# Patient Record
Sex: Female | Born: 1965 | Race: White | Hispanic: No | Marital: Married | Smoking: Former smoker
Health system: Southern US, Community
[De-identification: ages and names within clinical notes are randomized; demographics above are authoritative.]

## PROBLEM LIST (undated history)

## (undated) DIAGNOSIS — Z9889 Other specified postprocedural states: Secondary | ICD-10-CM

## (undated) DIAGNOSIS — F419 Anxiety disorder, unspecified: Secondary | ICD-10-CM

## (undated) DIAGNOSIS — T7840XA Allergy, unspecified, initial encounter: Secondary | ICD-10-CM

## (undated) DIAGNOSIS — D649 Anemia, unspecified: Secondary | ICD-10-CM

## (undated) HISTORY — PX: MYOMECTOMY ABDOMINAL APPROACH: SUR870

## (undated) HISTORY — DX: Anxiety disorder, unspecified: F41.9

## (undated) HISTORY — PX: RHINOPLASTY: SUR1284

## (undated) HISTORY — PX: OTHER SURGICAL HISTORY: SHX169

## (undated) HISTORY — DX: Anemia, unspecified: D64.9

## (undated) HISTORY — DX: Allergy, unspecified, initial encounter: T78.40XA

## (undated) HISTORY — PX: MANDIBLE SURGERY: SHX707

---

## 2001-12-08 ENCOUNTER — Other Ambulatory Visit: Admission: RE | Admit: 2001-12-08 | Discharge: 2001-12-08 | Payer: Self-pay | Admitting: Family Medicine

## 2001-12-15 ENCOUNTER — Encounter: Admission: RE | Admit: 2001-12-15 | Discharge: 2001-12-15 | Payer: Self-pay | Admitting: Family Medicine

## 2001-12-15 ENCOUNTER — Encounter: Payer: Self-pay | Admitting: Family Medicine

## 2002-02-23 ENCOUNTER — Encounter (INDEPENDENT_AMBULATORY_CARE_PROVIDER_SITE_OTHER): Payer: Self-pay | Admitting: *Deleted

## 2002-02-23 ENCOUNTER — Ambulatory Visit (HOSPITAL_COMMUNITY): Admission: RE | Admit: 2002-02-23 | Discharge: 2002-02-23 | Payer: Self-pay | Admitting: Gastroenterology

## 2004-01-24 ENCOUNTER — Other Ambulatory Visit: Admission: RE | Admit: 2004-01-24 | Discharge: 2004-01-24 | Payer: Self-pay | Admitting: Family Medicine

## 2005-02-22 ENCOUNTER — Encounter: Admission: RE | Admit: 2005-02-22 | Discharge: 2005-02-22 | Payer: Self-pay | Admitting: Family Medicine

## 2005-03-02 ENCOUNTER — Encounter: Admission: RE | Admit: 2005-03-02 | Discharge: 2005-03-02 | Payer: Self-pay | Admitting: Family Medicine

## 2005-05-06 ENCOUNTER — Other Ambulatory Visit: Admission: RE | Admit: 2005-05-06 | Discharge: 2005-05-06 | Payer: Self-pay | Admitting: Family Medicine

## 2005-08-10 ENCOUNTER — Inpatient Hospital Stay (HOSPITAL_COMMUNITY): Admission: RE | Admit: 2005-08-10 | Discharge: 2005-08-12 | Payer: Self-pay | Admitting: Obstetrics and Gynecology

## 2005-08-10 ENCOUNTER — Encounter (INDEPENDENT_AMBULATORY_CARE_PROVIDER_SITE_OTHER): Payer: Self-pay | Admitting: *Deleted

## 2006-05-09 ENCOUNTER — Other Ambulatory Visit: Admission: RE | Admit: 2006-05-09 | Discharge: 2006-05-09 | Payer: Self-pay | Admitting: Family Medicine

## 2007-06-16 ENCOUNTER — Other Ambulatory Visit: Admission: RE | Admit: 2007-06-16 | Discharge: 2007-06-16 | Payer: Self-pay | Admitting: Family Medicine

## 2007-10-13 ENCOUNTER — Encounter: Admission: RE | Admit: 2007-10-13 | Discharge: 2007-10-13 | Payer: Self-pay | Admitting: Family Medicine

## 2007-10-25 ENCOUNTER — Encounter: Admission: RE | Admit: 2007-10-25 | Discharge: 2007-10-25 | Payer: Self-pay | Admitting: Family Medicine

## 2007-10-25 ENCOUNTER — Encounter (INDEPENDENT_AMBULATORY_CARE_PROVIDER_SITE_OTHER): Payer: Self-pay | Admitting: Radiology

## 2007-11-03 ENCOUNTER — Encounter: Admission: RE | Admit: 2007-11-03 | Discharge: 2007-11-03 | Payer: Self-pay | Admitting: Family Medicine

## 2007-11-13 ENCOUNTER — Encounter (INDEPENDENT_AMBULATORY_CARE_PROVIDER_SITE_OTHER): Payer: Self-pay | Admitting: Diagnostic Radiology

## 2007-11-13 ENCOUNTER — Encounter: Admission: RE | Admit: 2007-11-13 | Discharge: 2007-11-13 | Payer: Self-pay | Admitting: Family Medicine

## 2008-03-11 ENCOUNTER — Ambulatory Visit (HOSPITAL_COMMUNITY): Admission: RE | Admit: 2008-03-11 | Discharge: 2008-03-11 | Payer: Self-pay | Admitting: Obstetrics and Gynecology

## 2008-05-10 ENCOUNTER — Encounter: Admission: RE | Admit: 2008-05-10 | Discharge: 2008-05-10 | Payer: Self-pay | Admitting: Family Medicine

## 2008-09-17 ENCOUNTER — Other Ambulatory Visit: Admission: RE | Admit: 2008-09-17 | Discharge: 2008-09-17 | Payer: Self-pay | Admitting: Family Medicine

## 2008-11-15 ENCOUNTER — Encounter: Admission: RE | Admit: 2008-11-15 | Discharge: 2008-11-15 | Payer: Self-pay | Admitting: Family Medicine

## 2009-11-12 ENCOUNTER — Other Ambulatory Visit: Admission: RE | Admit: 2009-11-12 | Discharge: 2009-11-12 | Payer: Self-pay | Admitting: Family Medicine

## 2010-12-25 NOTE — Procedures (Signed)
[place]  Patient:    Carol Sutton, Carol Sutton Visit Number: [id number] MRN: [id number]          Service Type: END Location: ENDO Attending Physician:  Orland Mustard Dictated by:   Llana Aliment. Randa Evens, M.D. Proc. Date: 02/23/02 Admit Date:  02/23/2002 Discharge Date: 02/23/2002   CC:         Stacie Acres. Cliffton Asters, M.D.   Procedure Report  DATE OF BIRTH:  11/09/2065  PROCEDURE:  Colonoscopy and biopsy.  MEDICATIONS:  Fentanyl 70 mcg, Versed 7 mg IV.  INDICATIONS FOR PROCEDURE:  Rectal bleeding in a 45 year old new onset.  DESCRIPTION OF PROCEDURE:  The procedure was explained to the patient and consent was obtained.  With the patient in the left lateral decubitus position the Olympus Pediatric video colonoscope was inserted under direct visualization.  Prep was excellent.  We were able to advance easily to the cecum.  The ileocecal valve and appendiceal orifice were seen.  The scope was withdrawn.  The colon was carefully examined.  the mucosa was completely normal on the right.  The colon transverse left and sigmoid colon no polyps or other lesions seen.  No diverticula.  The scope was withdrawn from the rectum. On retroflexed view the patient was seen to have internal hemorrhoids.  There was a very mild redness in the distal rectum.  A couple biopsies were obtain. The feeling was that this was probably some very distal proctitis.  The scope was withdrawn.  The patient tolerated the procedure well.  The patient was maintained on low flow oxygen and pulse oximetry throughout the procedure.  ASSESSMENT: 1. Probable mild proctitis. 2. Internal hemorrhoids.  PLAN:  Will check pathology and see back in the office in six to eight weeks. Dictated by:   Llana Aliment. Randa Evens, M.D. Attending Physician:  Orland Mustard DD:  02/23/02 TD:  02/28/02 Job: [id number]

## 2010-12-25 NOTE — H&P (Signed)
Carol Sutton, Carol NO.:  [id number]   MEDICAL RECORD NO.:  [id number]          PATIENT TYPE:  INP   LOCATION:  NA                            FACILITY:  [facility]   PHYSICIAN:  Gerald Leitz, MD          DATE OF BIRTH:  Jan 03, 1966   DATE OF ADMISSION:  DATE OF DISCHARGE:                                HISTORY & PHYSICAL   HISTORY OF PRESENT ILLNESS:  This is a 45 year old, G1, P1 with symptomatic  uterine fibroids who desires future fertility.  She had an ultrasound, on  May 11, 2005, at [facility] that showed an intramural fundal  fibroid that was 5.7 x 6.0-cm that slightly distorted the contour of the  endometrium, both ovaries appeared normal.  She has a history of  intramenstrual bleeding and has had a negative endometrial biopsy that was  performed on May 24, 2005.   PAST MEDICAL HISTORY:  Negative.   PAST GYNECOLOGIC HISTORY:  No history of sexually transmitted diseases.  No  history of abnormal Pap smears.  Her last Pap smear was May 06, 2005,  and this was normal.   PAST SURGICAL HISTORY:  1.  TMJ surgery.  2.  Rhinoplasty.   MEDICATIONS:  Yasmin and iron.   ALLERGIES:  No known drug allergies.   SOCIAL HISTORY:  The patient is divorced.  She currently works as a Human resources officer.  She has a history of tobacco use but quit in 2001.  She reports  approximately four glasses of wine every week.  Denies illicit drug use.   FAMILY HISTORY:  Mother with questionable uterine cancer.   REVIEW OF SYSTEMS:  Negative except as stated in the history of current  illness.   PHYSICAL EXAMINATION:  VITAL SIGNS:  Blood pressure 120/72, heart rate 72,  height 64 and 1/4 inches, weight 137.5 pounds.  CARDIOVASCULAR:  Regular rate and rhythm.  LUNGS:  Clear to auscultation bilaterally.  ABDOMEN:  Soft, nontender, nondistended.  No masses appreciated.  EXTREMITIES:  No clubbing, cyanosis, or edema.  PELVIC:  Normal external female genitalia.   No vulvar, vaginal, or cervical  lesions are noted.  Bimanual exam reveals a 12-week to 14-week size uterus  which is freely mobile.  No adnexal masses or tenderness.   ASSESSMENT:  Symptomatic uterine fibroids.   The patient desires future fertility.  Discussed with the patient surgical  options.  She desires a myomectomy.  Risks, benefits, alternatives of  myomectomy including but not limited to infection, bleeding, damage to  surrounding organs  such as bowel and bladder, and transfusion were discussed with the patient.  The risk of transfusion, HIV, hepatitis B, C, and syphilis were also  discussed.  The patient understands the risks.  She desires to proceed with  myomectomy and understands that this could be converted to a hysterectomy if  blood loss is severe.      Gerald Leitz, MD  Electronically Signed     TC/MEDQ  D:  08/04/2005  T:  08/04/2005  Job:  [id number]

## 2010-12-25 NOTE — Op Note (Signed)
NAMELAURETTA, Carol Sutton              ACCOUNT NO.:  [id number]   MEDICAL RECORD NO.:  [id number]          PATIENT TYPE:  INP   LOCATION:  9399                          FACILITY:  [facility]   PHYSICIAN:  Gerald Leitz, MD          DATE OF BIRTH:  [birth year]   DATE OF PROCEDURE:  08/10/2005  DATE OF DISCHARGE:                                 OPERATIVE REPORT   PREOPERATIVE DIAGNOSIS:  Symptomatic uterine fibroids and menorrhagia.   POSTOPERATIVE DIAGNOSIS:  Symptomatic uterine fibroids and menorrhagia.   PROCEDURE:  Abdominal myomectomy.   SURGEON:  Gerald Leitz, M.D.   ASSISTANT:  Bing Neighbors. Sydnee Cabal, M.D.   ANESTHESIA:  General.   SPECIMENS:  Uterine fibroid.   ESTIMATED BLOOD LOSS:  Minimal.   COMPLICATIONS:  None.   FINDINGS:  An approximately 7-cm fundal fibroid with distortion of the  endometrium.  Normal fallopian tubes and ovaries bilaterally.   DESCRIPTION OF PROCEDURE:  The patient was taken to the operating room where  she was placed under general anesthesia.  She was then prepped and draped in  the usual sterile fashion.   A Pfannenstiel skin incision was made with the scalpel and carried down to  the underlying layer of fascia.  The fascia was incised in the midline.  The  incision was extended laterally with Mayo scissors.  The superior aspect of  the fascial incision was grasped with Kocher clamps, elevated, and the  underlying rectus muscles dissected off with Mayo scissors.  This was  repeated on the inferior aspect of the fascial incision.  The rectus muscles  were separated in the midline.  The peritoneum was identified and entered  sharply with Metzenbaum scissors.  The incision was extended superiorly and  inferiorly, with good visualization of the bladder.  The abdomen was  inspected, with the findings noted above.  The uterus was exteriorized.  The  serosa of the uterus over the fibroid was injected with 10 cc of  vasopressin.  An incision was made in the uterus  with a scalpel.  The  fibroid was noted.  It was dissected out with blunt and sharp dissection.  The endometrium was noted to be compromised.  This was repaired with 4-0  Vicryl in a running fashion.  The uterine incision was repaired with 0  Monocryl in an interrupted fashion.  A second layer of the same suture was  used for hemostasis.  The serosa was closed with 4-0 Vicryl in a running  fashion using baseball stitch.  The abdomen was copiously irrigated.  Interceed was placed over the uterine incision.  The uterus was returned to  the abdomen.  The fascia was closed with 0 PDS in a running fashion.  The  skin was closed with 4-0 Vicryl.  Sponge, lap, and needle counts were  correct x2.   The patient was awakened from anesthesia and taken to the recovery room in  stable condition.  Please note that the patient should have a cesarean  section if she were to become pregnant in the future due to the location of  the uterine incision which was fundal and approximately 5 cm in length.  Again, a cesarean section is recommended for all future pregnancies.      Gerald Leitz, MD  Electronically Signed     TC/MEDQ  D:  08/10/2005  T:  08/10/2005  Job:  [id number]

## 2012-04-07 ENCOUNTER — Other Ambulatory Visit: Payer: Self-pay | Admitting: Family Medicine

## 2012-04-07 DIAGNOSIS — Z1231 Encounter for screening mammogram for malignant neoplasm of breast: Secondary | ICD-10-CM

## 2012-04-19 ENCOUNTER — Ambulatory Visit
Admission: RE | Admit: 2012-04-19 | Discharge: 2012-04-19 | Disposition: A | Discharge status: Home / Self Care | Payer: 59 | Source: Ambulatory Visit | Attending: Family Medicine | Admitting: Family Medicine

## 2012-04-19 DIAGNOSIS — Z1231 Encounter for screening mammogram for malignant neoplasm of breast: Secondary | ICD-10-CM

## 2012-08-21 ENCOUNTER — Ambulatory Visit: Payer: 59 | Admitting: Physician Assistant

## 2012-08-21 VITALS — BP 110/72 | HR 71 | Temp 98.1°F | Resp 18 | Ht 64.0 in | Wt 145.0 lb

## 2012-08-21 DIAGNOSIS — Z23 Encounter for immunization: Secondary | ICD-10-CM

## 2012-08-21 DIAGNOSIS — R300 Dysuria: Secondary | ICD-10-CM

## 2012-08-21 DIAGNOSIS — R350 Frequency of micturition: Secondary | ICD-10-CM

## 2012-08-21 LAB — POCT URINALYSIS DIPSTICK
Bilirubin, UA: NEGATIVE
Glucose, UA: NEGATIVE
Nitrite, UA: NEGATIVE
Spec Grav, UA: 1.015
Urobilinogen, UA: 0.2

## 2012-08-21 LAB — POCT UA - MICROSCOPIC ONLY

## 2012-08-21 MED ORDER — PHENAZOPYRIDINE HCL 200 MG PO TABS: 200.0000 mg | ORAL_TABLET | Freq: Three times a day (TID) | ORAL | Status: DC | PRN

## 2012-08-21 MED ORDER — CIPROFLOXACIN HCL 500 MG PO TABS: 500.0000 mg | ORAL_TABLET | Freq: Two times a day (BID) | ORAL | Status: DC

## 2012-08-21 NOTE — Progress Notes (Signed)
Patient ID: Carol Sutton MRN: [id number], DOB: [birth year], 47 y.o. Date of Encounter: 08/21/2012, 12:02 PM  Primary Physician: Cala Bradford, MD  Chief Complaint: urinary frequency and dysuria  HPI: 47 y.o. year old female with presents with 1 day history of urinary frequency, dysuria, and suprapubic pressure. Denies flank pain, nausea, vomiting, fever, chills, vaginal discharge, or odor. Has not tried any OTC medications yet. LNMP 1 year ago.  Patient is otherwise doing well without issues or complaints.  Past Medical History  Diagnosis Date  . Allergy   . Anemia      Home Meds: Prior to Admission medications   Medication Sig Start Date End Date Taking? Authorizing Provider  Nutritional Supplements (ESTROVEN PM PO) Take by mouth.   Yes Historical Provider, MD  zolpidem (AMBIEN) 10 MG tablet Take 10 mg by mouth at bedtime as needed.   Yes Historical Provider, MD    Allergies:  Allergies  Allergen Reactions  . Sulfa Antibiotics Swelling and Rash    History   Social History  . Marital Status: Married    Spouse Name: N/A    Number of Children: N/A  . Years of Education: N/A   Occupational History  . Not on file.   Social History Main Topics  . Smoking status: Never Smoker   . Smokeless tobacco: Not on file  . Alcohol Use: Yes  . Drug Use: No  . Sexually Active: Yes    Birth Control/ Protection: Implant   Other Topics Concern  . Not on file   Social History Narrative  . No narrative on file     Review of Systems: Constitutional: negative for chills, fever, night sweats, weight changes, or fatigue  HEENT: negative for vision changes, hearing loss, congestion, rhinorrhea, ST, epistaxis, or sinus pressure Cardiovascular: negative for chest pain or palpitations Respiratory: negative for cough, hemoptysis, wheezing, shortness of breath. Abdominal: positive for suprapubic abdominal pain,   No nausea, vomiting, diarrhea, or constipation Genitourinary:  positive for urinary frequency and dysuria. Negative for vaginal discharge, odor, or pelvic pain.  Dermatological: negative for rashes. Neurologic: negative for headache, dizziness, or syncope   Physical Exam: Blood pressure 110/72, pulse 71, temperature 98.1 F (36.7 C), temperature source Oral, resp. rate 18, height 5\' 4"  (1.626 m), weight 145 lb (65.772 kg), SpO2 99.00%., Body mass index is 24.89 kg/(m^2). General: Well developed, well nourished, in no acute distress. Head: Normocephalic, atraumatic, eyes without discharge, sclera non-icteric, nares are without discharge. External ear normal in appearance. Neck: Supple. No thyromegaly. Full ROM. No lymphadenopathy. Lungs: Clear bilaterally to auscultation without wheezes, rales, or rhonchi. Breathing is unlabored. Heart: RRR with S1 S2. No murmurs, rubs, or gallops appreciated. Abdominal: +BS x 4. No hepatosplenomegaly, rebound tenderness, or guarding. Positive suprapubic tenderness. No CVA tenderness bilaterally.  Msk:  Strength and tone normal for age. Extremities/Skin: Warm and dry. No clubbing or cyanosis. No edema.  Neuro: Alert and oriented X 3. Moves all extremities spontaneously. Gait is normal. CNII-XII grossly in tact. Psych:  Responds to questions appropriately with a normal affect.   Labs: Results for orders placed in visit on 08/21/12  POCT URINALYSIS DIPSTICK      Component Value Range   Color, UA pinkish-yellow     Clarity, UA cloudy     Glucose, UA neg     Bilirubin, UA neg     Ketones, UA neg     Spec Grav, UA 1.015     Blood, UA large  pH, UA 6.0     Protein, UA 100     Urobilinogen, UA 0.2     Nitrite, UA neg     Leukocytes, UA large (3+)    POCT UA - MICROSCOPIC ONLY      Component Value Range   WBC, Ur, HPF, POC 15-25     RBC, urine, microscopic TNTC     Bacteria, U Microscopic trace     Mucus, UA neg     Epithelial cells, urine per micros 2-4     Crystals, Ur, HPF, POC neg     Casts, Ur, LPF, POC  neg     Yeast, UA neg      ASSESSMENT AND PLAN:  47 y.o. year old female with urinary tract infection Urine culture sent Increase fluids Cipro 500 mg bid x 5 days Pyridium tid as needed for symptomatic relief Follow up if symptoms worsen or fail to improve.  Grier Mitts, PA-C 08/21/2012 12:02 PM

## 2012-08-23 LAB — URINE CULTURE: Colony Count: >=100000

## 2012-10-14 ENCOUNTER — Ambulatory Visit: Payer: 59 | Admitting: Physician Assistant

## 2012-10-14 VITALS — BP 102/62 | HR 66 | Temp 97.8°F | Resp 16 | Ht 64.0 in | Wt 145.0 lb

## 2012-10-14 DIAGNOSIS — H109 Unspecified conjunctivitis: Secondary | ICD-10-CM

## 2012-10-14 MED ORDER — KETOROLAC TROMETHAMINE 0.5 % OP SOLN: 1.0000 [drp] | Freq: Four times a day (QID) | OPHTHALMIC | Status: DC

## 2012-10-14 NOTE — Patient Instructions (Signed)
If your symptoms are not improved in the next weeks, or if they worsen, please schedule a visit with your eye specialist. An excellent rewetting drop for eyes is Systane Balance.

## 2012-10-14 NOTE — Progress Notes (Signed)
  Subjective:    Patient ID: Carol Sutton, female    DOB: March 03, 1966, 47 y.o.   MRN: [id number]  HPI This 47 y.o. female presents for evaluation of eye redness and crusting x several weeks, intermittently.  No pain or itching.  Using OTC drops without relief.  One of the drops (for grittiness, redness) caused stinging. No visual change, but feels like a film is over her eyes.  No drainage during the day, crusting on the lashes each morning.  No known sick contacts, no recent illness.   Past Medical History  Diagnosis Date  . Allergy   . Anemia     Past Surgical History  Procedure Laterality Date  . Fibroid tumor      Prior to Admission medications   Medication Sig Start Date End Date Taking? Authorizing Provider  Nutritional Supplements (ESTROVEN PM PO) Take by mouth.   Yes Historical Provider, MD  zolpidem (AMBIEN) 10 MG tablet Take 10 mg by mouth at bedtime as needed.   Yes Historical Provider, MD    Allergies  Allergen Reactions  . Sulfa Antibiotics Swelling and Rash    History   Social History  . Marital Status: Married    Spouse Name: Thayer Ohm    Number of Children: 1  . Years of Education: 16   Occupational History  . BUSINESS MANAGER [employer]   Social History Main Topics  . Smoking status: Former Smoker    Quit date: 10/15/2007  . Smokeless tobacco: Never Used  . Alcohol Use: 0 - 2.5 oz/week    0-5 drink(s) per week  . Drug Use: No  . Sexually Active: Yes -- Female partner(s)    Birth Control/ Protection: IUD     Comment: Mirena   Other Topics Concern  . Not on file   Social History Narrative   Lives with her husband.    Family History  Problem Relation Age of Onset  . Hyperlipidemia Mother   . Heart disease Father      Review of Systems As above.    Objective:   Physical Exam BP 102/62  Pulse 66  Temp(Src) 97.8 F (36.6 C) (Oral)  Resp 16  Ht 5\' 4"  (1.626 m)  Wt 145 lb (65.772 kg)  BMI 24.88 kg/m2  SpO2 98%  Visual Acuity Screening   Right eye Left eye Both eyes  Without correction: 20/15 20/20 20/15   With correction:      WDWNWF, A&Ox 3. [place]/AT, PERRL, EOMI.  Mild injection of the conjunctiva.  No drainage.  No cloudiness of the anterior chamber.  Funduscopic exam normal bilaterally.  No periauricular nodes.  No cervical nodes.  Heart: Regular rate.  Lungs: Normal effort.  Skin is warm and dry without lesion.     Assessment & Plan:  Conjunctivitis unspecified - Plan: ketorolac (ACULAR) 0.5 % ophthalmic solution  Patient Instructions  If your symptoms are not improved in the next weeks, or if they worsen, please schedule a visit with your eye specialist. An excellent rewetting drop for eyes is Systane Balance.

## 2013-03-12 ENCOUNTER — Other Ambulatory Visit: Payer: Self-pay | Admitting: Family Medicine

## 2013-03-12 DIAGNOSIS — Z0000 Encounter for general adult medical examination without abnormal findings: Secondary | ICD-10-CM | POA: Insufficient documentation

## 2013-04-11 ENCOUNTER — Other Ambulatory Visit (HOSPITAL_COMMUNITY)
Admission: RE | Admit: 2013-04-11 | Discharge: 2013-04-11 | Disposition: A | Discharge status: Home / Self Care | Payer: 59 | Source: Ambulatory Visit | Attending: Family Medicine | Admitting: Family Medicine

## 2013-05-16 ENCOUNTER — Ambulatory Visit (INDEPENDENT_AMBULATORY_CARE_PROVIDER_SITE_OTHER): Payer: 59

## 2013-05-16 VITALS — BP 95/62 | HR 80 | Resp 20 | Ht 64.0 in | Wt 145.0 lb

## 2013-05-16 DIAGNOSIS — G5760 Lesion of plantar nerve, unspecified lower limb: Secondary | ICD-10-CM

## 2013-05-16 DIAGNOSIS — G5762 Lesion of plantar nerve, left lower limb: Secondary | ICD-10-CM

## 2013-05-16 NOTE — Patient Instructions (Signed)
Morton's Neuroma Neuralgia (nerve pain) or neuroma (benign [non-cancerous] nerve tumor) may develop on any interdigital nerve. The interdigital nerves (nerves between digits) of the foot travel beneath and between the metatarsals (long bones of the fore foot) and pass the nerve endings to the toes. The third interdigital is a common place for a small neuroma to form called Morton's neuroma. Another nerve to be affected commonly is the fourth interdigital nerve. This would be in approximately in the area of the base or ball under the bottom of your fourth toe. This condition occurs more commonly in women and is usually on one side. It is usually first noticed by pain radiating (spreading) to the ball of the foot or to the toes. CAUSES The cause of interdigital neuralgia may be from low grade repetitive trauma (damage caused by an accident) as in activities causing a repeated pounding of the foot (running, jumping etc.). It is also caused by improper footwear or recent loss of the fatty padding on the bottom of the foot. TREATMENT  The condition often resolves (goes away) simply with decreasing activity if that is thought to be the cause. Proper shoes are beneficial. Orthotics (special foot support aids) such as a metatarsal bar are often beneficial. This condition usually responds to conservative therapy, however if surgery is necessary it usually brings complete relief. HOME CARE INSTRUCTIONS   Apply ice to the area of soreness for 15-20 minutes, 3-4 times per day, while awake for the first 2 days. Put ice in a plastic bag and place a towel between the bag of ice and your skin.  Only take over-the-counter or prescription medicines for pain, discomfort, or fever as directed by your caregiver. MAKE SURE YOU:   Understand these instructions.  Will watch your condition.  Will get help right away if you are not doing well or get worse. Document Released: 11/01/2000 Document Revised: 10/18/2011 Document  Reviewed: 07/26/2005 Exit[employer] Patient Information 2014 [employer] ICE INSTRUCTIONS  Apply ice or cold pack to the affected area at least 3 times a day for 10-15 minutes each time.  You should also use ice after prolonged activity or vigorous exercise.  Do not apply ice longer than 20 minutes at one time.  Always keep a cloth between your skin and the ice pack to prevent [place].  Being consistent and following these instructions will help control your symptoms.  We suggest you purchase a gel ice pack because they are reusable and do bit leak.  Some of them are designed to wrap around the area.  Use the method that works best for you.  Here are some other suggestions for icing.   Use a frozen bag of peas or corn-inexpensive and molds well to your body, usually stays frozen for 10 to 20 minutes.  Wet a towel with cold water and squeeze out the excess until it's damp.  Place in a bag in the freezer for 20 minutes. Then remove and use. 

## 2013-05-16 NOTE — Progress Notes (Signed)
  Subjective:    Patient ID: Carol Sutton, female    DOB: 12/09/1965, 47 y.o.   MRN: [id number]  HPI Comments: "It feels better for about a week after shot, then it starts to hurt like before."  patient presents for followup of neuroma she's had 6 alcohol sclerosing injections at this point to the third interspace of her left foot. Patient describes improvement however by the end of the week has had flareups with shooting pain into the toes again.    Review of Systems  Constitutional: Negative.   HENT: Negative.   Eyes: Negative.   Respiratory: Negative.   Cardiovascular: Negative.   Gastrointestinal: Negative.   Endocrine: Positive for cold intolerance.  Genitourinary: Negative.   Musculoskeletal: Negative.   Skin: Negative.   Allergic/Immunologic: Positive for food allergies.  Neurological: Negative.   Hematological: Negative.        Objective:   Physical Exam  Constitutional: She is oriented to person, place, and time. She appears well-developed and well-nourished.  Cardiovascular: Intact distal pulses.   Capillary fill time 3 seconds all digits bilateral  Neurological: She is alert and oriented to person, place, and time. She has normal reflexes.  Skin: Skin is warm and dry.  Psychiatric: She has a normal mood and affect. Her behavior is normal. Judgment and thought content normal.   neurovascular status is intact with pedal pulses palpable. Epicritic and proprioceptive sensations intact and symmetric bilateral. May be some reduced sensation third interspace left consistent with series of injections. Orthopedic biomechanical exam otherwise unremarkable rectus foot type and digits are noted dermatologic exam unremarkable.        Assessment & Plan:  Morton's neuroma left third interspace resolving are improving slowly, some symptoms still present. Injection delivered today 1 mL 4% dehydrated alcohol and 0.5% Marcaine plain delivered to the third interspace left foot, third,  and plantar digital nerve.  Followup in one month for long-term reevaluation, if no improvement may be a candidate for surgical option.  Alvan Dame DPM

## 2013-05-18 ENCOUNTER — Ambulatory Visit: Payer: Self-pay

## 2013-05-18 ENCOUNTER — Ambulatory Visit: Payer: 59

## 2013-06-13 ENCOUNTER — Ambulatory Visit (INDEPENDENT_AMBULATORY_CARE_PROVIDER_SITE_OTHER): Payer: 59

## 2013-06-13 VITALS — BP 107/66 | HR 78 | Resp 12

## 2013-06-13 DIAGNOSIS — M778 Other enthesopathies, not elsewhere classified: Secondary | ICD-10-CM

## 2013-06-13 DIAGNOSIS — G5762 Lesion of plantar nerve, left lower limb: Secondary | ICD-10-CM

## 2013-06-13 DIAGNOSIS — G5760 Lesion of plantar nerve, unspecified lower limb: Secondary | ICD-10-CM

## 2013-06-13 DIAGNOSIS — M7750 Other enthesopathy of unspecified foot: Secondary | ICD-10-CM

## 2013-06-13 MED ORDER — PREDNISONE 10 MG PO KIT: 1.0000 | PACK | Freq: Four times a day (QID) | ORAL | Status: DC

## 2013-06-13 NOTE — Progress Notes (Signed)
  Subjective:    Patient ID: Carol Sutton, female    DOB: Mar 20, 1966, 47 y.o.   MRN: [id number]  HPI Comments: '' LT FOOT IS DOING OK''  Foot Pain   continues to have some slight foot pain on the left foot on direct lateral compression does not have is intense pain as it had initially. However continues to have some pain especially when she gets up in the morning feels stiff or achy. Does not have a lot of sharper shooting pain on direct lateral compression. No Mulder sign noted.    Review of Systems deferred at this visit     Objective:   Physical Exam Neurovascular status is intact and unchanged pedal pulses are palpable epicritic and proprioceptive sensations intact. There is decreased sensation in the fourth interspace consistent with the alcohol injections reducing sensation. Her patient remained fact have some mild arthropathy or capsulitis of the MTP joints themselves in particular third and fourth MTP joints. No significant swelling is noted no discoloration is noted. No pain on dorsiflexion or plantarflexion of the digits.     Assessment & Plan:  Assessment some improvement in neuroma symptomology third interspace neuroma left foot. Following alcohol sclerosing injections x7. There continues to be possible capsulitis and posse some inflammatory pain of the fourth interspace and third and fourth MTP joints. As an adjunct to previous treatment patient at this time is placed on a Sterapred Ds Dosepak x12 days.. Maintain accommodative shoes also continue with ice packs to the affected areas. Followup within the next one to 2 months likely after the holiday. If she continues to have difficulty make consider either additional injections or possibly surgical interventions.  Alvan Dame DPM

## 2013-06-13 NOTE — Patient Instructions (Signed)
ICE INSTRUCTIONS  Apply ice or cold pack to the affected area at least 3 times a day for 10-15 minutes each time.  You should also use ice after prolonged activity or vigorous exercise.  Do not apply ice longer than 20 minutes at one time.  Always keep a cloth between your skin and the ice pack to prevent [place].  Being consistent and following these instructions will help control your symptoms.  We suggest you purchase a gel ice pack because they are reusable and do bit leak.  Some of them are designed to wrap around the area.  Use the method that works best for you.  Here are some other suggestions for icing.   Use a frozen bag of peas or corn-inexpensive and molds well to your body, usually stays frozen for 10 to 20 minutes.  Wet a towel with cold water and squeeze out the excess until it's damp.  Place in a bag in the freezer for 20 minutes. Then remove and use. 

## 2013-06-22 ENCOUNTER — Telehealth: Payer: Self-pay | Admitting: *Deleted

## 2013-06-22 NOTE — Telephone Encounter (Signed)
Pt states received 48 Prednisone 10mg , and no instructions.  DR Ralene Cork state is a 12 day pk tapering down from 6 pills.

## 2013-08-29 ENCOUNTER — Ambulatory Visit (INDEPENDENT_AMBULATORY_CARE_PROVIDER_SITE_OTHER): Payer: 59

## 2013-08-29 VITALS — BP 126/68 | HR 62 | Resp 12

## 2013-08-29 DIAGNOSIS — M7752 Other enthesopathy of left foot: Secondary | ICD-10-CM

## 2013-08-29 DIAGNOSIS — G5760 Lesion of plantar nerve, unspecified lower limb: Secondary | ICD-10-CM

## 2013-08-29 DIAGNOSIS — M79609 Pain in unspecified limb: Secondary | ICD-10-CM

## 2013-08-29 DIAGNOSIS — M7750 Other enthesopathy of unspecified foot: Secondary | ICD-10-CM

## 2013-08-29 NOTE — Progress Notes (Signed)
   Subjective:    Patient ID: Carol Sutton, female    DOB: 10/31/1965, 48 y.o.   MRN: [id number]  HPI Comments: '' LT FOOT STILL HURTING AND NOTHING CHANGE.''  Foot Pain      Review of Systems no changes     Objective:   Physical Exam Neurovascular status is intact pedal pulses palpable. Patient still not having pain on direct compression and lateral compression of the proximal interspace cover the distalat the MTP level particular run the fourth MTP still some tenderness on direct and not as much on lateral compression. Still some slight paresthesia or neuritis or neuralgia present as such patient is given option of an initial booster alcohol injection versus possible surgical intervention and excision of neuroma. Affect is considerably better although not completely resolved my recommendation is an additional alcohol injection at this time also maintain ice compression and an NSAID as needed for pain reevaluate in one month       Assessment & Plan:  Assessment persistent Morton's neuroma posse some mild residual arthrosis and capsulitis fourth MTP area has had improvement as far as neuroma symptomology with a series of alcohol injections at this time an a.c. injection 1 cc 4% alcohol solution and 0.5% Marcaine plain delivered more distal into the third interspace. Patient tolerated the injection will continue with ice packs and NSAIDs recheck in 4 weeks for followup  Carol Sutton DPM

## 2013-08-29 NOTE — Patient Instructions (Signed)
ICE INSTRUCTIONS  Apply ice or cold pack to the affected area at least 3 times a day for 10-15 minutes each time.  You should also use ice after prolonged activity or vigorous exercise.  Do not apply ice longer than 20 minutes at one time.  Always keep a cloth between your skin and the ice pack to prevent [place].  Being consistent and following these instructions will help control your symptoms.  We suggest you purchase a gel ice pack because they are reusable and do bit leak.  Some of them are designed to wrap around the area.  Use the method that works best for you.  Here are some other suggestions for icing.   Use a frozen bag of peas or corn-inexpensive and molds well to your body, usually stays frozen for 10 to 20 minutes.  Wet a towel with cold water and squeeze out the excess until it's damp.  Place in a bag in the freezer for 20 minutes. Then remove and use. 

## 2013-10-03 ENCOUNTER — Ambulatory Visit (INDEPENDENT_AMBULATORY_CARE_PROVIDER_SITE_OTHER): Payer: 59

## 2013-10-03 VITALS — BP 117/67 | HR 76 | Resp 12

## 2013-10-03 DIAGNOSIS — M79609 Pain in unspecified limb: Secondary | ICD-10-CM

## 2013-10-03 DIAGNOSIS — G5760 Lesion of plantar nerve, unspecified lower limb: Secondary | ICD-10-CM

## 2013-10-03 NOTE — Progress Notes (Signed)
° °  Subjective:    Patient ID: Carol Sutton, female    DOB: 1966/02/20, 48 y.o.   MRN: [id number]  HPI '' LT FOOT STILL HAVING AND NOTHING CHANGE.''    Review of Systems     Objective:   Physical Exam        Assessment & Plan:

## 2013-10-03 NOTE — Patient Instructions (Signed)
ICE INSTRUCTIONS  Apply ice or cold pack to the affected area at least 3 times a day for 10-15 minutes each time.  You should also use ice after prolonged activity or vigorous exercise.  Do not apply ice longer than 20 minutes at one time.  Always keep a cloth between your skin and the ice pack to prevent [place].  Being consistent and following these instructions will help control your symptoms.  We suggest you purchase a gel ice pack because they are reusable and do bit leak.  Some of them are designed to wrap around the area.  Use the method that works best for you.  Here are some other suggestions for icing.   Use a frozen bag of peas or corn-inexpensive and molds well to your body, usually stays frozen for 10 to 20 minutes.  Wet a towel with cold water and squeeze out the excess until it's damp.  Place in a bag in the freezer for 20 minutes. Then remove and use.  Followup arrangements for physical therapy for leaks 3 weeks 3 times a week

## 2013-10-03 NOTE — Progress Notes (Signed)
   Subjective:    Patient ID: Francenia Chimenti, female    DOB: 1965-10-31, 48 y.o.   MRN: [id number]  HPI patient continues to pain third intermetatarsal space left foot the third and fourth toes continues to pain burning pain not severe especially the still present despite a series of alcohol injections we 7 injections or taken place has a steroid injections has other changes shoes exercise NSAID therapies. At this time discussed further options    Review of Systems no new changes or findings noted     Objective:   Physical Exam Neurovascular status is intact pedal pulses palpable epicritic and proprioceptive sensations intact there is significant pain on direct compression third intermetatarsal space between the third and fourth metatarsals of the left foot. Pain with enclosed shoe wear and ambulation again persistent with Morton's neuroma symptomology nonresponsive to the alcohol injections thus far. Discussed other options including acupuncture physical therapy is brought is an option as well surgical intervention. At this time prescription for physical therapy is given 3 times a week for 3 weeks reevaluate with in 2 months for possible surgical intervention if there is no improvement with therapy       Assessment & Plan:  Assessment persistent Morton's neuroma symptomology interspace left foot with Mild capsulitis at this time so for physical therapy ultrasound iontophoresis and possible TENS unit 3 times a week for leaks 3-4 weeks reappointed within 2 months for followup and reevaluation  [name] DPM

## 2014-05-27 ENCOUNTER — Other Ambulatory Visit: Payer: Self-pay

## 2014-05-27 DIAGNOSIS — D229 Melanocytic nevi, unspecified: Secondary | ICD-10-CM

## 2014-05-27 HISTORY — DX: Melanocytic nevi, unspecified: D22.9

## 2014-08-15 ENCOUNTER — Other Ambulatory Visit: Payer: Self-pay

## 2015-03-24 ENCOUNTER — Other Ambulatory Visit (HOSPITAL_COMMUNITY)
Admission: RE | Admit: 2015-03-24 | Discharge: 2015-03-24 | Disposition: A | Discharge status: Home / Self Care | Payer: 59 | Source: Ambulatory Visit | Attending: Obstetrics and Gynecology | Admitting: Obstetrics and Gynecology

## 2015-03-24 ENCOUNTER — Other Ambulatory Visit: Payer: Self-pay | Admitting: Obstetrics and Gynecology

## 2015-03-24 DIAGNOSIS — Z1151 Encounter for screening for human papillomavirus (HPV): Secondary | ICD-10-CM | POA: Insufficient documentation

## 2015-03-24 DIAGNOSIS — Z01419 Encounter for gynecological examination (general) (routine) without abnormal findings: Secondary | ICD-10-CM | POA: Diagnosis present

## 2015-03-26 LAB — CYTOLOGY - PAP

## 2015-12-12 ENCOUNTER — Other Ambulatory Visit: Payer: Self-pay

## 2015-12-12 DIAGNOSIS — Z1231 Encounter for screening mammogram for malignant neoplasm of breast: Secondary | ICD-10-CM

## 2016-02-02 ENCOUNTER — Ambulatory Visit
Admission: RE | Admit: 2016-02-02 | Discharge: 2016-02-02 | Disposition: A | Discharge status: Home / Self Care | Payer: 59 | Source: Ambulatory Visit

## 2016-02-02 DIAGNOSIS — Z1231 Encounter for screening mammogram for malignant neoplasm of breast: Secondary | ICD-10-CM

## 2017-01-06 DIAGNOSIS — Z1211 Encounter for screening for malignant neoplasm of colon: Secondary | ICD-10-CM | POA: Diagnosis not present

## 2017-01-06 DIAGNOSIS — R5383 Other fatigue: Secondary | ICD-10-CM | POA: Diagnosis not present

## 2017-01-06 DIAGNOSIS — G4700 Insomnia, unspecified: Secondary | ICD-10-CM | POA: Diagnosis not present

## 2017-01-17 DIAGNOSIS — S61402A Unspecified open wound of left hand, initial encounter: Secondary | ICD-10-CM | POA: Diagnosis not present

## 2017-01-19 DIAGNOSIS — S61402D Unspecified open wound of left hand, subsequent encounter: Secondary | ICD-10-CM | POA: Diagnosis not present

## 2017-07-11 DIAGNOSIS — J029 Acute pharyngitis, unspecified: Secondary | ICD-10-CM | POA: Diagnosis not present

## 2017-07-11 DIAGNOSIS — Z23 Encounter for immunization: Secondary | ICD-10-CM | POA: Diagnosis not present

## 2017-09-23 DIAGNOSIS — Z8719 Personal history of other diseases of the digestive system: Secondary | ICD-10-CM | POA: Diagnosis not present

## 2017-09-23 DIAGNOSIS — Z8489 Family history of other specified conditions: Secondary | ICD-10-CM | POA: Diagnosis not present

## 2017-12-28 DIAGNOSIS — Z8371 Family history of colonic polyps: Secondary | ICD-10-CM | POA: Diagnosis not present

## 2017-12-28 DIAGNOSIS — Z800 Family history of malignant neoplasm of digestive organs: Secondary | ICD-10-CM | POA: Diagnosis not present

## 2017-12-28 DIAGNOSIS — Z1211 Encounter for screening for malignant neoplasm of colon: Secondary | ICD-10-CM | POA: Diagnosis not present

## 2018-04-24 ENCOUNTER — Other Ambulatory Visit: Payer: Self-pay | Admitting: Family Medicine

## 2018-04-24 DIAGNOSIS — Z1231 Encounter for screening mammogram for malignant neoplasm of breast: Secondary | ICD-10-CM

## 2018-05-03 DIAGNOSIS — E559 Vitamin D deficiency, unspecified: Secondary | ICD-10-CM | POA: Diagnosis not present

## 2018-05-04 ENCOUNTER — Other Ambulatory Visit: Payer: Self-pay

## 2018-05-04 DIAGNOSIS — L821 Other seborrheic keratosis: Secondary | ICD-10-CM | POA: Diagnosis not present

## 2018-05-04 DIAGNOSIS — D485 Neoplasm of uncertain behavior of skin: Secondary | ICD-10-CM | POA: Diagnosis not present

## 2018-05-04 DIAGNOSIS — D229 Melanocytic nevi, unspecified: Secondary | ICD-10-CM | POA: Diagnosis not present

## 2018-05-26 ENCOUNTER — Ambulatory Visit
Admission: RE | Admit: 2018-05-26 | Discharge: 2018-05-26 | Disposition: A | Discharge status: Home / Self Care | Payer: 59 | Source: Ambulatory Visit | Attending: Family Medicine | Admitting: Family Medicine

## 2018-05-26 DIAGNOSIS — Z1231 Encounter for screening mammogram for malignant neoplasm of breast: Secondary | ICD-10-CM

## 2018-06-12 ENCOUNTER — Other Ambulatory Visit: Payer: Self-pay

## 2018-06-12 DIAGNOSIS — D485 Neoplasm of uncertain behavior of skin: Secondary | ICD-10-CM | POA: Diagnosis not present

## 2018-06-12 DIAGNOSIS — L821 Other seborrheic keratosis: Secondary | ICD-10-CM | POA: Diagnosis not present

## 2018-06-12 DIAGNOSIS — D229 Melanocytic nevi, unspecified: Secondary | ICD-10-CM | POA: Diagnosis not present

## 2018-09-04 DIAGNOSIS — L820 Inflamed seborrheic keratosis: Secondary | ICD-10-CM | POA: Diagnosis not present

## 2018-10-19 DIAGNOSIS — L723 Sebaceous cyst: Secondary | ICD-10-CM | POA: Diagnosis not present

## 2018-10-19 DIAGNOSIS — L259 Unspecified contact dermatitis, unspecified cause: Secondary | ICD-10-CM | POA: Diagnosis not present

## 2018-10-19 DIAGNOSIS — L089 Local infection of the skin and subcutaneous tissue, unspecified: Secondary | ICD-10-CM | POA: Diagnosis not present

## 2019-08-07 ENCOUNTER — Ambulatory Visit: Payer: 59 | Attending: Internal Medicine

## 2019-08-07 DIAGNOSIS — Z20822 Contact with and (suspected) exposure to covid-19: Secondary | ICD-10-CM

## 2019-08-08 LAB — NOVEL CORONAVIRUS, NAA: SARS-CoV-2, NAA: DETECTED — AB

## 2019-08-09 ENCOUNTER — Encounter: Payer: Self-pay | Admitting: *Deleted

## 2019-12-04 ENCOUNTER — Other Ambulatory Visit: Payer: Self-pay | Admitting: Family Medicine

## 2019-12-04 DIAGNOSIS — Z1231 Encounter for screening mammogram for malignant neoplasm of breast: Secondary | ICD-10-CM

## 2019-12-05 ENCOUNTER — Encounter: Payer: Self-pay | Admitting: *Deleted

## 2019-12-06 ENCOUNTER — Ambulatory Visit
Admission: RE | Admit: 2019-12-06 | Discharge: 2019-12-06 | Disposition: A | Discharge status: Home / Self Care | Payer: 59 | Source: Ambulatory Visit

## 2019-12-06 ENCOUNTER — Ambulatory Visit (INDEPENDENT_AMBULATORY_CARE_PROVIDER_SITE_OTHER): Payer: 59 | Admitting: Physician Assistant

## 2019-12-06 ENCOUNTER — Other Ambulatory Visit: Payer: Self-pay

## 2019-12-06 ENCOUNTER — Encounter: Payer: Self-pay | Admitting: Physician Assistant

## 2019-12-06 DIAGNOSIS — L719 Rosacea, unspecified: Secondary | ICD-10-CM | POA: Diagnosis not present

## 2019-12-06 DIAGNOSIS — B078 Other viral warts: Secondary | ICD-10-CM | POA: Diagnosis not present

## 2019-12-06 DIAGNOSIS — Z1283 Encounter for screening for malignant neoplasm of skin: Secondary | ICD-10-CM

## 2019-12-06 DIAGNOSIS — L820 Inflamed seborrheic keratosis: Secondary | ICD-10-CM

## 2019-12-06 DIAGNOSIS — Z1231 Encounter for screening mammogram for malignant neoplasm of breast: Secondary | ICD-10-CM

## 2019-12-06 DIAGNOSIS — L709 Acne, unspecified: Secondary | ICD-10-CM

## 2019-12-06 MED ORDER — TRETINOIN 0.025 % EX CREA: TOPICAL_CREAM | Freq: Every evening | CUTANEOUS | 2 refills | Status: AC

## 2019-12-06 MED ORDER — NONFORMULARY OR COMPOUNDED ITEM: 1.0000 "application " | Freq: Once | 1 refills | Status: AC

## 2019-12-06 NOTE — Progress Notes (Signed)
   Follow up Visit  Subjective  Carol Sutton is a 54 y.o. female who presents for the following: Annual Exam (Main concern is her forehead.  She has been breaking out for couple months and she feels like she is getting cystic acne. Still using her Retin A nightly.  ). Forehead stays red centrally and also getting big acne bumps there and on lower face. She has been using her Retin A which doesn't seem to aggravate this area. This has been going on for about 1 year. This area on the forehead does not itch or burn and is not sensitive when she gets a facial. She also has many keratoses that are bothering her and rubbing on her clothes. She also has a scaling on her right middle finger PIP joint.   Objective  Well appearing patient in no apparent distress; mood and affect are within normal limits.  A full examination was performed including head, eyes, ears, nose, lips, neck, chest, axillae, abdomen, back, buttocks, bilateral upper extremities, bilateral lower extremities, hands, feet, fingers, toes, fingernails, and toenails. All findings within normal limits unless otherwise noted below. No suspicious moles noted on back.   Objective  Mid Forehead: Erythematous papules and pustules with comedones   Objective  Chest - Medial (Center) (4), Mid Back (5), Right Forearm - Anterior: Erythematous stuck-on, waxy papule or plaque.   Objective  Right 3rd Finger Proximal Interphalangeal Joint: Verrucous papules -- Discussed viral etiology and contagion.   Objective  Mid Forehead: Centrifacial erythema with or without papules/pustules forehead.   Assessment & Plan   Skin Cancer screening performed today.  Acne, unspecified acne type Mid Forehead  tretinoin (RETIN-A) 0.025 % cream - Mid Forehead  Inflamed seborrheic keratosis (10) Right Forearm - Anterior; Chest - Medial (Center) (4); Mid Back (5)  Destruction of lesion - Chest - Medial (Center), Mid Back, Right Forearm -  Anterior Complexity: simple   Destruction method: cryotherapy   Informed consent: discussed and consent obtained   Timeout:  patient name, date of birth, surgical site, and procedure verified Lesion destroyed using liquid nitrogen: Yes   Outcome: patient tolerated procedure well with no complications    Other viral warts Right 3rd Finger Proximal Interphalangeal Joint  Destruction of lesion - Right 3rd Finger Proximal Interphalangeal Joint  Destruction method: cryotherapy   Informed consent: discussed and consent obtained    Rosacea Mid Forehead  2% metronidazole in 1%hydrocortisone cream bid to forehead.   NONFORMULARY OR COMPOUNDED ITEM - Mid Forehead   I have reviewed the above documentation for accuracy and completeness, and I agree with the above.  Everlena Mackley Clark-Bruning PA-C

## 2020-01-14 ENCOUNTER — Ambulatory Visit: Payer: 59 | Admitting: Physician Assistant

## 2020-02-27 IMAGING — MG DIGITAL SCREENING BILATERAL MAMMOGRAM WITH TOMO AND CAD
8 series · 9 of 24 positions shown · non-contrast
Comparison: Previous exam(s).

CLINICAL DATA: Screening.

EXAM:
DIGITAL SCREENING BILATERAL MAMMOGRAM WITH TOMO AND CAD

[R CC synth-2D]
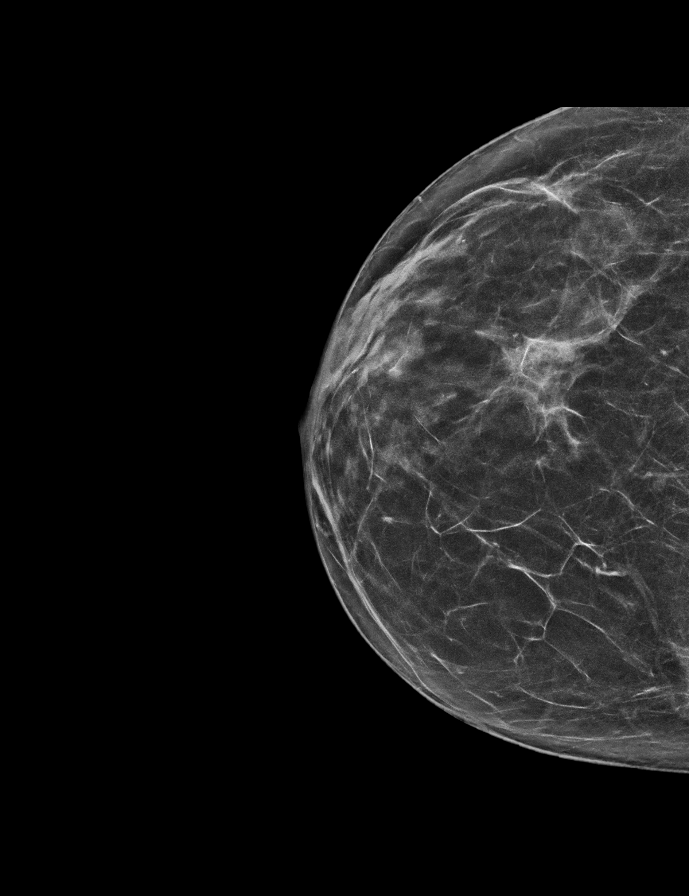

[R MLO synth-2D]
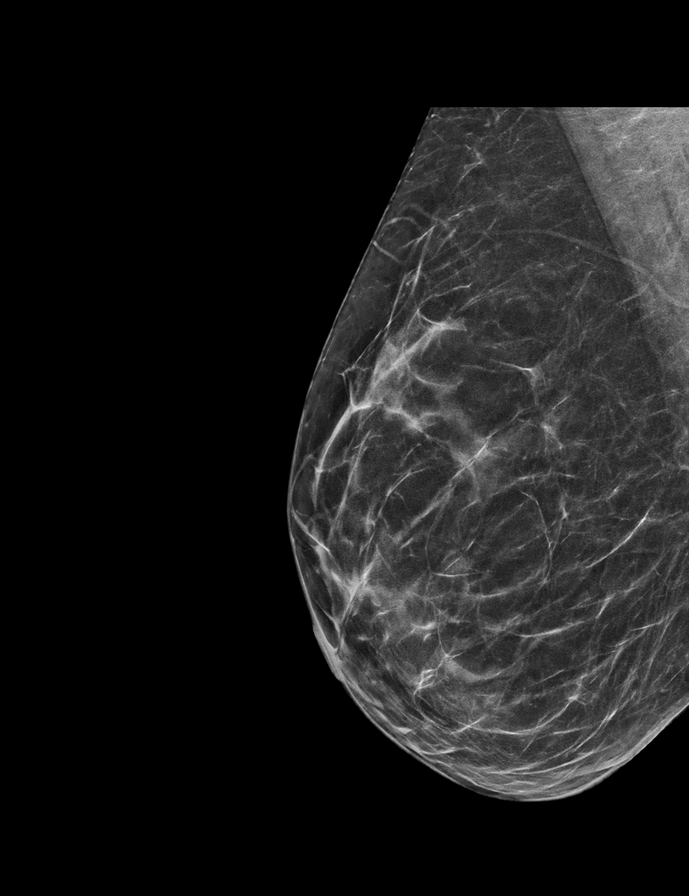

[L CC synth-2D]
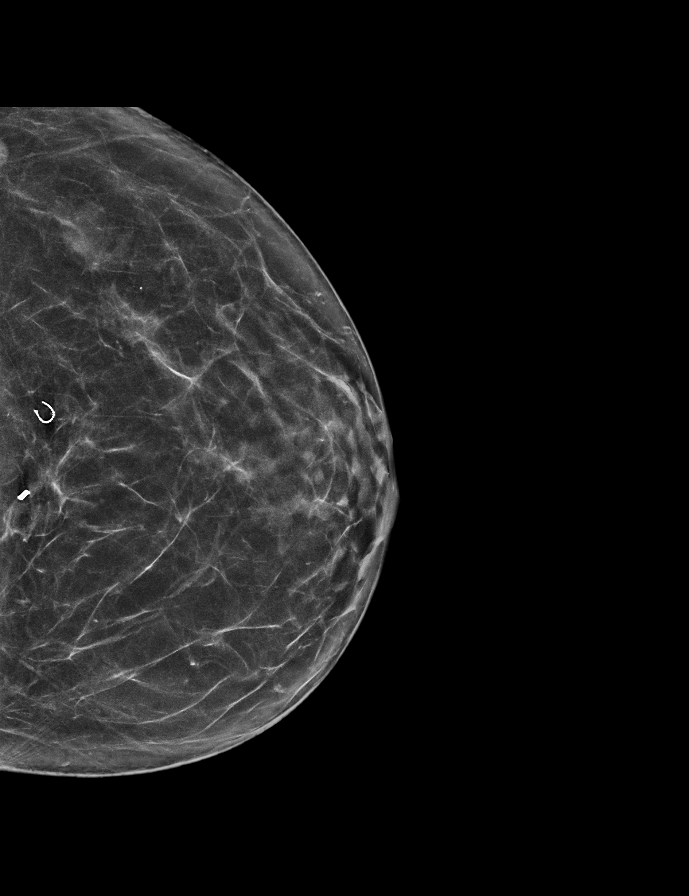

[L MLO synth-2D]
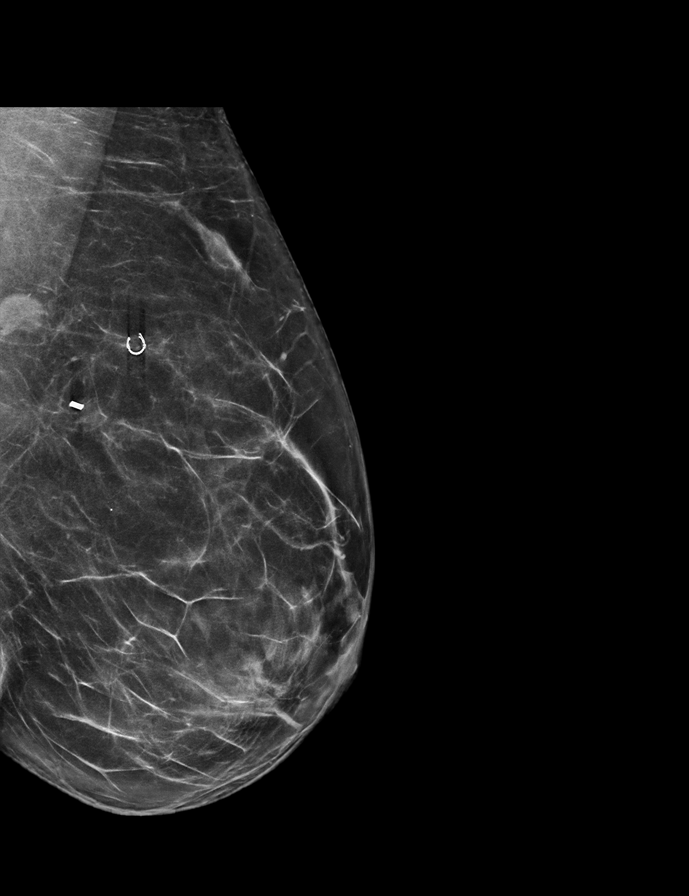

[R MLO tomo · 2 of 62 frames shown]
[frame 21/62]
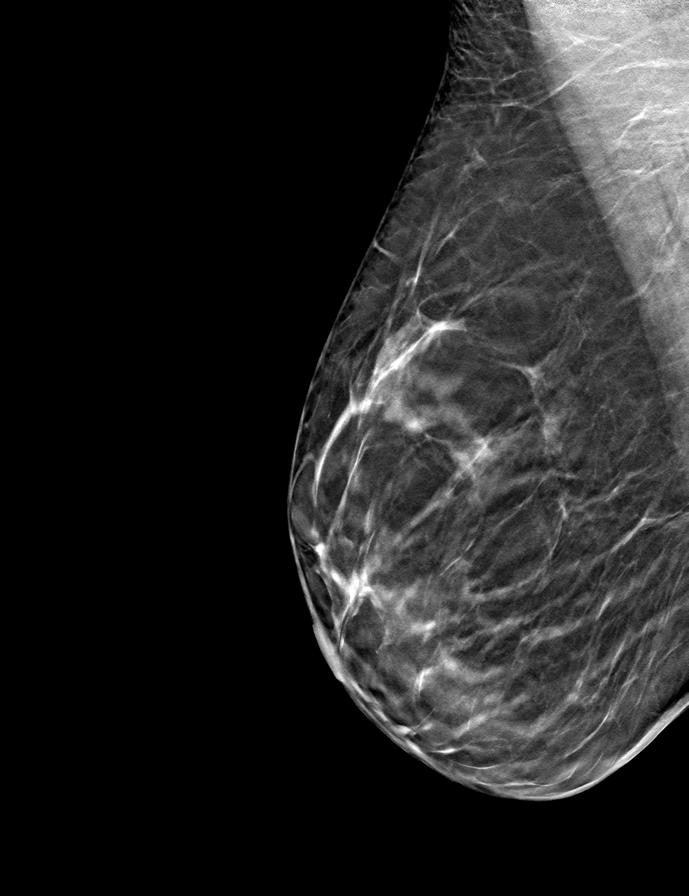
[frame 31/62]
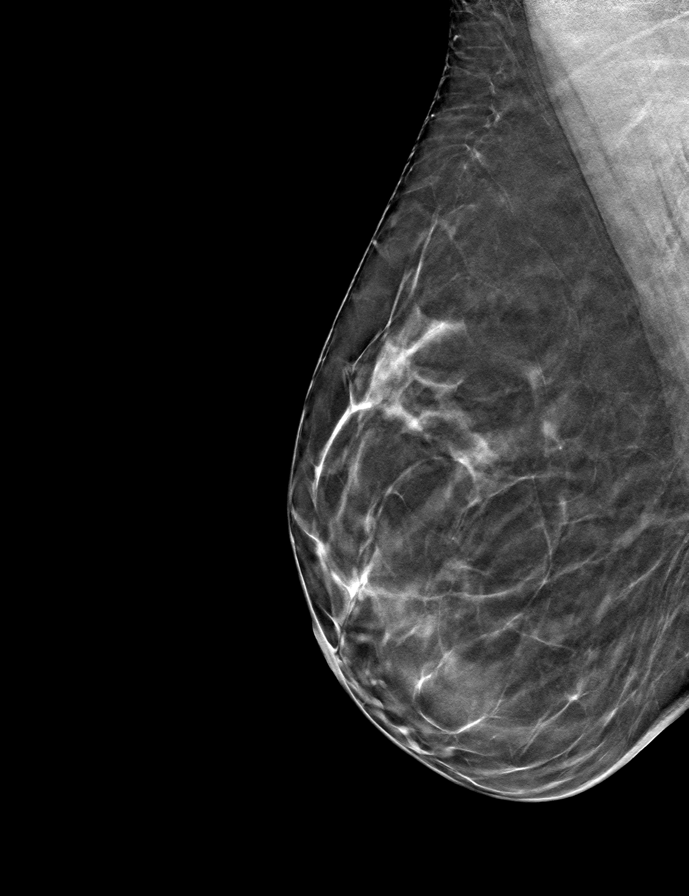

[L CC tomo · tomo slice 31/60.0]
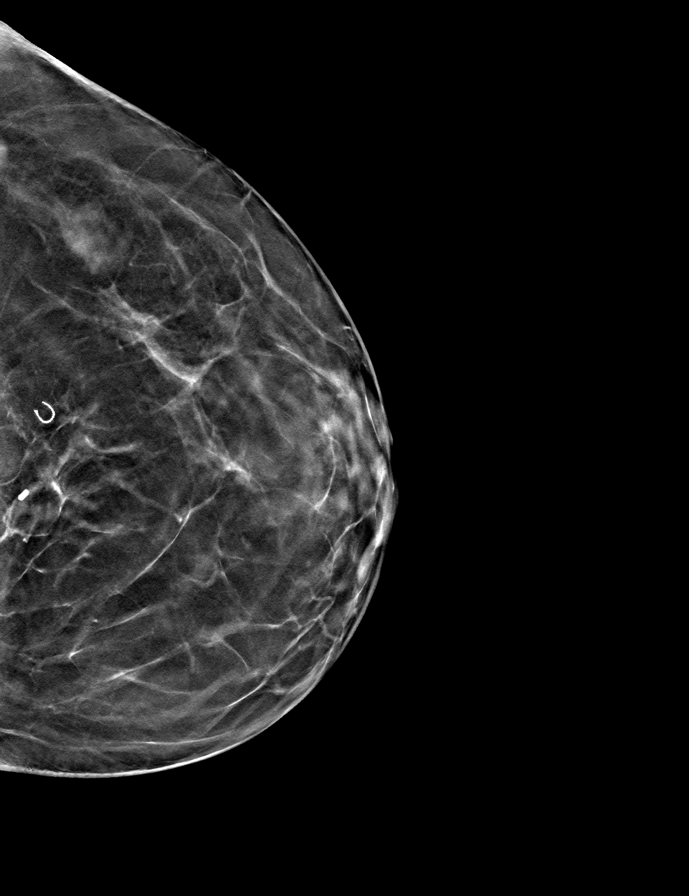

[L MLO tomo · tomo slice 35/70.0]
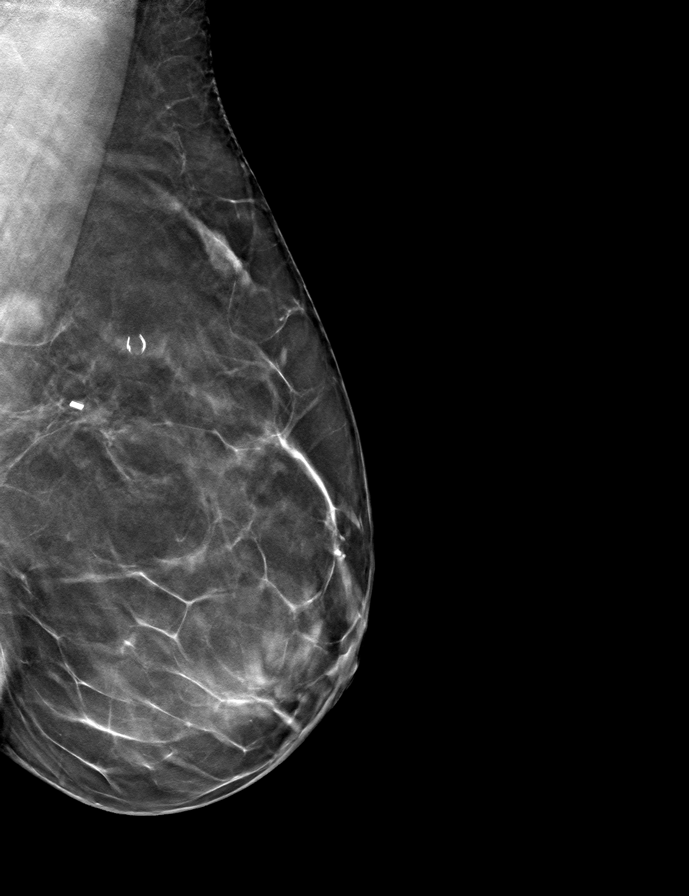

[R CC tomo · tomo slice 29/57.0]
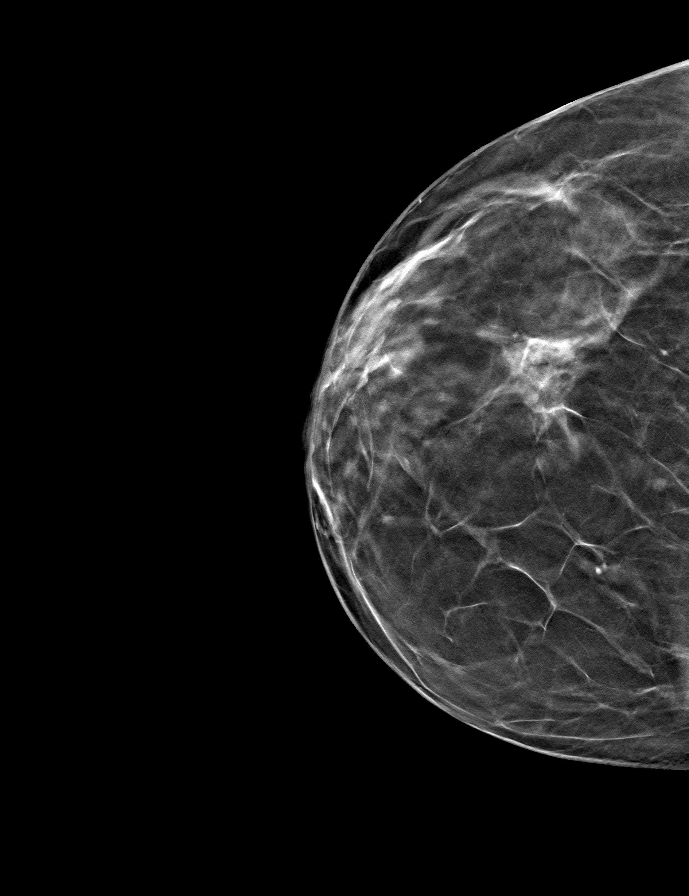

[9 of 24 positions shown; findings below may reference images not displayed]

ACR Breast Density Category b: There are scattered areas of
fibroglandular density.
FINDINGS: There are no findings suspicious for malignancy. Images were
processed with CAD.
IMPRESSION: No mammographic evidence of malignancy. A result letter of this
screening mammogram will be mailed directly to the patient.

RECOMMENDATION:
Screening mammogram in one year. (Code:CN-U-775)

BI-RADS CATEGORY  1: Negative.

## 2020-05-01 ENCOUNTER — Other Ambulatory Visit: Payer: Self-pay

## 2020-05-01 ENCOUNTER — Encounter: Payer: Self-pay | Admitting: Physician Assistant

## 2020-05-01 ENCOUNTER — Ambulatory Visit (INDEPENDENT_AMBULATORY_CARE_PROVIDER_SITE_OTHER): Payer: 59 | Admitting: Physician Assistant

## 2020-05-01 DIAGNOSIS — S50362A Insect bite (nonvenomous) of left elbow, initial encounter: Secondary | ICD-10-CM | POA: Diagnosis not present

## 2020-05-01 DIAGNOSIS — L239 Allergic contact dermatitis, unspecified cause: Secondary | ICD-10-CM

## 2020-05-01 DIAGNOSIS — W57XXXA Bitten or stung by nonvenomous insect and other nonvenomous arthropods, initial encounter: Secondary | ICD-10-CM | POA: Diagnosis not present

## 2020-05-01 DIAGNOSIS — Z1283 Encounter for screening for malignant neoplasm of skin: Secondary | ICD-10-CM

## 2020-05-01 MED ORDER — CLOBETASOL PROPIONATE 0.05 % EX CREA: 1.0000 "application " | TOPICAL_CREAM | Freq: Two times a day (BID) | CUTANEOUS | 6 refills | Status: AC

## 2020-05-01 NOTE — Progress Notes (Signed)
   Follow-Up Visit   Subjective  Carol Sutton is a 54 y.o. female who presents for the following: Skin Problem (Left elbow had some bite like marks x 1 month ago. Tx hydrocortisone and allergy pills. comes and goes. Itches alot and gets warm to the touch. Also check left ring finger dermatitis. ).   The following portions of the chart were reviewed this encounter and updated as appropriate: Tobacco  Allergies  Meds  Problems  Med Hx  Surg Hx  Fam Hx      Objective  Well appearing patient in no apparent distress; mood and affect are within normal limits.  A focused examination was performed including back, chest arms and hands.. Relevant physical exam findings are noted in the Assessment and Plan.  Objective  Left Elbow - Posterior: Whelps with punctate scab in center x 6  Objective  Left Proximal 3rd Finger: Thin and  scaly erythematous xerosis. Ring finger only.  Objective  Back and chest: No atypical nevi No signs of non-mole skin cancer. All scars clear   Assessment & Plan  Insect bite of left elbow, initial encounter Left Elbow - Posterior  clobetasol cream (TEMOVATE) 0.05 % - Left Elbow - Posterior  Allergic contact dermatitis, unspecified trigger Left Proximal 3rd Finger  clobetasol cream (TEMOVATE) 0.05 % - Left Proximal 3rd Finger  Screening exam for skin cancer Back and chest  Yearly skin exams    I, Kielyn Kardell, PA-C, have reviewed all documentation's for this visit.  The documentation on 05/01/20 for the exam, diagnosis, procedures and orders are all accurate and complete.

## 2020-05-29 ENCOUNTER — Other Ambulatory Visit (HOSPITAL_COMMUNITY)
Admission: RE | Admit: 2020-05-29 | Discharge: 2020-05-29 | Disposition: A | Discharge status: Home / Self Care | Payer: 59 | Source: Ambulatory Visit | Attending: Family Medicine | Admitting: Family Medicine

## 2020-05-29 DIAGNOSIS — Z124 Encounter for screening for malignant neoplasm of cervix: Secondary | ICD-10-CM | POA: Diagnosis present

## 2020-05-30 LAB — CYTOLOGY - PAP
Comment: NEGATIVE
Diagnosis: NEGATIVE
High risk HPV: NEGATIVE

## 2020-08-07 ENCOUNTER — Other Ambulatory Visit: Payer: 59

## 2020-08-07 DIAGNOSIS — Z20822 Contact with and (suspected) exposure to covid-19: Secondary | ICD-10-CM

## 2020-08-09 LAB — NOVEL CORONAVIRUS, NAA: SARS-CoV-2, NAA: NOT DETECTED

## 2020-08-09 LAB — SARS-COV-2, NAA 2 DAY TAT

## 2021-01-14 ENCOUNTER — Encounter: Payer: Self-pay | Admitting: Physician Assistant

## 2021-01-14 ENCOUNTER — Ambulatory Visit (INDEPENDENT_AMBULATORY_CARE_PROVIDER_SITE_OTHER): Payer: 59 | Admitting: Physician Assistant

## 2021-01-14 ENCOUNTER — Other Ambulatory Visit: Payer: Self-pay

## 2021-01-14 DIAGNOSIS — D485 Neoplasm of uncertain behavior of skin: Secondary | ICD-10-CM

## 2021-01-14 DIAGNOSIS — L988 Other specified disorders of the skin and subcutaneous tissue: Secondary | ICD-10-CM

## 2021-01-14 DIAGNOSIS — Z808 Family history of malignant neoplasm of other organs or systems: Secondary | ICD-10-CM

## 2021-01-14 DIAGNOSIS — Z1283 Encounter for screening for malignant neoplasm of skin: Secondary | ICD-10-CM

## 2021-01-14 DIAGNOSIS — L570 Actinic keratosis: Secondary | ICD-10-CM | POA: Diagnosis not present

## 2021-01-14 MED ORDER — TRETINOIN 0.05 % EX CREA: TOPICAL_CREAM | Freq: Every day | CUTANEOUS | 11 refills | Status: AC

## 2021-01-14 NOTE — Patient Instructions (Signed)

## 2021-01-14 NOTE — Progress Notes (Signed)
   Follow-Up Visit   Subjective  Carol Sutton is a 55 y.o. female who presents for the following: Annual Exam (No new concerns. No personal history of melanoma or non mole skin cancer. Per records review history of atypical nevi.  Positive family history of melanoma and non mole skin cancers. ).   The following portions of the chart were reviewed this encounter and updated as appropriate:  Tobacco  Allergies  Meds  Problems  Med Hx  Surg Hx  Fam Hx      Objective  Well appearing patient in no apparent distress; mood and affect are within normal limits.  A full examination was performed including scalp, head, eyes, ears, nose, lips, neck, chest, axillae, abdomen, back, buttocks, bilateral upper extremities, bilateral lower extremities, hands, feet, fingers, toes, fingernails, and toenails. All findings within normal limits unless otherwise noted below.  Objective  Mid Upper Back: Pearly papule with telangectasia.        Objective  Mid Parietal Scalp: Hyperkeratotic scale with pink base        Objective  Left Nasal Sidewall, Right Temporal Scalp: Erythematous patches with gritty scale.  Objective  Mid Forehead: Age related wrinkles   Assessment & Plan  Neoplasm of uncertain behavior of skin (2) Mid Upper Back  Skin / nail biopsy Type of biopsy: tangential   Informed consent: discussed and consent obtained   Timeout: patient name, date of birth, surgical site, and procedure verified   Procedure prep:  Patient was prepped and draped in usual sterile fashion (Non sterile) Prep type:  Chlorhexidine Anesthesia: the lesion was anesthetized in a standard fashion   Anesthetic:  1% lidocaine w/ epinephrine 1-100,000 local infiltration Instrument used: flexible razor blade   Hemostasis achieved with: aluminum chloride and electrodesiccation   Outcome: patient tolerated procedure well   Post-procedure details: sterile dressing applied and wound care instructions  given   Dressing type: bandage and petrolatum    Specimen 1 - Surgical pathology Differential Diagnosis: R/O Atypia - cautery  Check Margins: Yes  Mid Parietal Scalp  Skin / nail biopsy Type of biopsy: tangential   Informed consent: discussed and consent obtained   Timeout: patient name, date of birth, surgical site, and procedure verified   Procedure prep:  Patient was prepped and draped in usual sterile fashion (Non sterile) Prep type:  Chlorhexidine Anesthesia: the lesion was anesthetized in a standard fashion   Anesthetic:  1% lidocaine w/ epinephrine 1-100,000 local infiltration Instrument used: flexible razor blade   Hemostasis achieved with: aluminum chloride and electrodesiccation   Outcome: patient tolerated procedure well   Post-procedure details: wound care instructions given    Specimen 2 - Surgical pathology Differential Diagnosis: R/O Wart  Check Margins: No  AK (actinic keratosis) (2) Left Nasal Sidewall; Right Temporal Scalp  Destruction of lesion - Left Nasal Sidewall, Right Temporal Scalp Complexity: simple   Destruction method: cryotherapy   Informed consent: discussed and consent obtained   Timeout:  patient name, date of birth, surgical site, and procedure verified Lesion destroyed using liquid nitrogen: Yes   Cryotherapy cycles:  3 Outcome: patient tolerated procedure well with no complications   Post-procedure details: wound care instructions given    Rhytides Mid Forehead  tretinoin (RETIN-A) 0.05 % cream - Mid Forehead    I, Alayzia Pavlock, PA-C, have reviewed all documentation's for this visit.  The documentation on 01/14/21 for the exam, diagnosis, procedures and orders are all accurate and complete.

## 2021-02-02 ENCOUNTER — Encounter: Payer: Self-pay | Admitting: Physician Assistant

## 2021-04-14 ENCOUNTER — Telehealth: Payer: Self-pay | Admitting: *Deleted

## 2021-04-14 NOTE — Telephone Encounter (Signed)
Created in error

## 2021-04-15 ENCOUNTER — Telehealth: Payer: Self-pay

## 2021-04-15 NOTE — Telephone Encounter (Signed)
[facility] sent over a refill for metr 2% in [facility] 1% CREAM. What is the patient using this for not mentioned at the last visit

## 2021-04-22 ENCOUNTER — Telehealth: Payer: Self-pay | Admitting: Physician Assistant

## 2021-04-22 NOTE — Telephone Encounter (Signed)
Phone call to [facility] to okay patient's refill of Metro Cream 2% in [facility] 1%. Pharmacy aware.

## 2021-04-22 NOTE — Telephone Encounter (Signed)
Phone call to patient to see what she's using the metro cream for?  Per patient she is using the cream for her Rosacea.  I informed patient I would speak with Robyne Askew, PA to see if she will okay the refill. Per Robyne Askew, PA refill okay.

## 2021-04-22 NOTE — Telephone Encounter (Signed)
[employer] [phone]) left message on office voice mail saying that they were calling for a refill request for Carol Sutton for Metro Cream 2% in [facility] 1%.

## 2022-01-19 ENCOUNTER — Encounter: Payer: Self-pay | Admitting: Physician Assistant

## 2022-01-19 ENCOUNTER — Ambulatory Visit (INDEPENDENT_AMBULATORY_CARE_PROVIDER_SITE_OTHER): Payer: 59 | Admitting: Physician Assistant

## 2022-01-19 DIAGNOSIS — Z1283 Encounter for screening for malignant neoplasm of skin: Secondary | ICD-10-CM | POA: Diagnosis not present

## 2022-01-19 DIAGNOSIS — Z86018 Personal history of other benign neoplasm: Secondary | ICD-10-CM

## 2022-01-19 DIAGNOSIS — Z808 Family history of malignant neoplasm of other organs or systems: Secondary | ICD-10-CM

## 2022-01-26 ENCOUNTER — Encounter: Payer: Self-pay | Admitting: Physician Assistant

## 2022-01-26 NOTE — Progress Notes (Signed)
   Follow-Up Visit   Subjective  Carol Sutton is a 56 y.o. female who presents for the following: Annual Exam (Patient here today for yearly skin check, no concerns today. Personal history of atypical moles. Family history of melanoma (daughter).).   The following portions of the chart were reviewed this encounter and updated as appropriate:  Tobacco  Allergies  Meds  Problems  Med Hx  Surg Hx  Fam Hx      Objective  Well appearing patient in no apparent distress; mood and affect are within normal limits.  A full examination was performed including scalp, head, eyes, ears, nose, lips, neck, chest, axillae, abdomen, back, buttocks, bilateral upper extremities, bilateral lower extremities, hands, feet, fingers, toes, fingernails, and toenails. All findings within normal limits unless otherwise noted below.  Full body skin examination -No atypical nevi or signs of NMSC noted at the time of the visit.    Assessment & Plan  Encounter for screening for malignant neoplasm of skin  Yearly skin examination     I, Norberta Stobaugh, PA-C, have reviewed all documentation's for this visit.  The documentation on 01/26/22 for the exam, diagnosis, procedures and orders are all accurate and complete.

## 2022-06-22 ENCOUNTER — Encounter: Payer: Self-pay | Admitting: Physical Therapy

## 2022-06-22 ENCOUNTER — Ambulatory Visit: Payer: 59 | Attending: Family Medicine | Admitting: Physical Therapy

## 2022-06-22 DIAGNOSIS — H8112 Benign paroxysmal vertigo, left ear: Secondary | ICD-10-CM | POA: Diagnosis not present

## 2022-06-22 NOTE — Therapy (Signed)
OUTPATIENT PHYSICAL THERAPY VESTIBULAR EVALUATION     Patient Name: Carol Sutton MRN: [id number] DOB:[birth year], 56 y.o., female Today's Date: 06/23/2022   PT End of Session - 06/23/22 1700     Visit Number 1    Number of Visits 1    Authorization Type [employer]    PT Start Time 0845    PT Stop Time 0930    PT Time Calculation (min) 45 min    Activity Tolerance Patient tolerated treatment well    Behavior During Therapy [facility] for tasks assessed/performed             Past Medical History:  Diagnosis Date   Allergy    Anemia    Anxiety    Atypical mole 05/27/2014   left breast-tx widershave   Past Surgical History:  Procedure Laterality Date   fibroid tumor     There are no problems to display for this patient.   PCP: Harlan Stains, MD REFERRING PROVIDER: Harlan Stains, MD  REFERRING DIAG: BPPV  THERAPY DIAG:  BPPV (benign paroxysmal positional vertigo), left  ONSET DATE: Referral date 06-10-22  Rationale for Evaluation and Treatment: Rehabilitation  SUBJECTIVE:   SUBJECTIVE STATEMENT: Pt reports this episode started about a year ago - gets dizzy if she turns onto her left side or looks up Pt accompanied by: self  PERTINENT HISTORY: unremarkable  PAIN:  Are you having pain? No  PRECAUTIONS: None  WEIGHT BEARING RESTRICTIONS: No  FALLS: Has patient fallen in last 6 months? No  LIVING ENVIRONMENT: Lives with: lives with their family Lives in: House/apartment  PLOF: Independent  PATIENT GOALS: resolve the vertigo  OBJECTIVE:    Cervical ROM:  WNL's   STRENGTH: WNL's   BED MOBILITY:  Independent   GAIT: Gait pattern: WFL   VESTIBULAR ASSESSMENT:  GENERAL OBSERVATION: pt amb. Independently without device - no unsteadiness noted   SYMPTOM BEHAVIOR:  Subjective history: pt states episodic vertigo has been going on for about a year  Non-Vestibular symptoms:  N/A  Type of dizziness: Spinning/Vertigo  Frequency: depends on  movement  Duration: seconds to minutes  Aggravating factors: Induced by position change: lying supine and rolling to the right  Relieving factors: head stationary and rolling to left   Progression of symptoms: unchanged  OCULOMOTOR EXAM:  Ocular Alignment: normal  Ocular ROM: No Limitations  Spontaneous Nystagmus: absent  Gaze-Induced Nystagmus: absent     POSITIONAL TESTING: Right Dix-Hallpike: no nystagmus Left Dix-Hallpike: upbeating, left nystagmus    VESTIBULAR TREATMENT:                                                                                                   DATE: 06-22-22  Canalith Repositioning:  Epley Left: Number of Reps: 2, Response to Treatment: symptoms improved, and Comment: pt continued to c/o dizziness with head turns to Lt side in supine position - improved with habituation after approx. 4 reps  PATIENT EDUCATION: Education details: eval results - Epley for self treatment prn Person educated: Patient Education method: Explanation and Demonstration Education comprehension: verbalized understanding and returned demonstration  HOME EXERCISE  PROGRAM:  GOALS:  N/A due to eval only    ASSESSMENT:  CLINICAL IMPRESSION: Patient is a 56 y.o.  lady who was seen today for physical therapy evaluation and treatment for Lt BPPV.  Pt was treated for Lt BPPV with Epley maneuver for 2 reps.  No nystagmus and no c/o vertigo was noted or reported on 2nd rep.  Pt was instructed in Epley maneuver for self treatment prn; no follow up appt scheduled as pt did not feel it was needed at this time.   OBJECTIVE IMPAIRMENTS: dizziness.   ACTIVITY LIMITATIONS: bending, reach over head, and caring for others  PARTICIPATION LIMITATIONS: interpersonal relationship  PERSONAL FACTORS:  N/A  are also affecting patient's functional outcome.   REHAB POTENTIAL: Good  CLINICAL DECISION MAKING: Stable/uncomplicated  EVALUATION COMPLEXITY: Low   PLAN:  PT FREQUENCY: one  time visit  PT DURATION: 1 week  PLANNED INTERVENTIONS: Therapeutic exercises, Therapeutic activity, Neuromuscular re-education, Balance training, Gait training, Patient/Family education, Self Care, and Canalith repositioning  PLAN FOR NEXT SESSION: N/A - eval only as BPPV appears to be resolved with treatment provided during eval session   Kerim Statzer, [place], PT 06/23/2022, 9:33 PM

## 2022-06-22 NOTE — Patient Instructions (Signed)
Self Treatment for Left Posterior / Anterior Canalithiasis    Sitting on bed: 1. Turn head 45 left. (a) Lie back slowly, shoulders on pillow, head on bed. (b) Hold _20___ seconds. 2. Keeping head on bed, turn head 90 right. Hold __20__ seconds. 3. Roll to right, head on 45 angle down toward bed. Hold _20___ seconds. 4. Sit up on right side of bed. Repeat _3___ times per session. Do __2__ sessions per day.  Copyright  VHI. All rights reserved.

## 2022-06-23 ENCOUNTER — Encounter: Payer: Self-pay | Admitting: Physical Therapy

## 2023-02-14 ENCOUNTER — Other Ambulatory Visit: Payer: Self-pay | Admitting: Family Medicine

## 2023-02-14 DIAGNOSIS — Z1231 Encounter for screening mammogram for malignant neoplasm of breast: Secondary | ICD-10-CM

## 2023-02-17 ENCOUNTER — Ambulatory Visit
Admission: RE | Admit: 2023-02-17 | Discharge: 2023-02-17 | Disposition: A | Discharge status: Home / Self Care | Payer: 59 | Source: Ambulatory Visit

## 2023-02-17 DIAGNOSIS — Z1231 Encounter for screening mammogram for malignant neoplasm of breast: Secondary | ICD-10-CM

## 2023-03-29 ENCOUNTER — Ambulatory Visit: Payer: 59 | Admitting: Obstetrics and Gynecology

## 2023-04-13 ENCOUNTER — Ambulatory Visit (INDEPENDENT_AMBULATORY_CARE_PROVIDER_SITE_OTHER): Payer: 59 | Admitting: Obstetrics and Gynecology

## 2023-04-13 ENCOUNTER — Encounter: Payer: Self-pay | Admitting: Obstetrics and Gynecology

## 2023-04-13 VITALS — BP 108/73 | HR 76 | Ht 63.62 in | Wt 159.0 lb

## 2023-04-13 DIAGNOSIS — R350 Frequency of micturition: Secondary | ICD-10-CM

## 2023-04-13 DIAGNOSIS — N812 Incomplete uterovaginal prolapse: Secondary | ICD-10-CM

## 2023-04-13 DIAGNOSIS — N941 Unspecified dyspareunia: Secondary | ICD-10-CM | POA: Diagnosis not present

## 2023-04-13 LAB — POCT URINALYSIS DIPSTICK
Bilirubin, UA: NEGATIVE
Blood, UA: NEGATIVE
Glucose, UA: NEGATIVE
Ketones, UA: NEGATIVE
Leukocytes, UA: NEGATIVE
Nitrite, UA: NEGATIVE
Protein, UA: NEGATIVE
Spec Grav, UA: 1.02 (ref 1.010–1.025)
Urobilinogen, UA: 0.2 U/dL
pH, UA: 7 (ref 5.0–8.0)

## 2023-04-13 NOTE — Progress Notes (Signed)
[facility] New Patient Evaluation and Consultation  Referring Provider: Gerald Leitz, MD PCP: Laurann Montana, MD Date of Service: 04/13/2023  SUBJECTIVE Chief Complaint: New Patient (Initial Visit) Carol Sutton is a 57 y.o. female here for a cystocele consult. Pt said she has no leaking or incontinence./)  History of Present Illness: Carol Sutton is a 57 y.o. White or Caucasian female seen in consultation at the request of Dr. Richardson Dopp for evaluation of prolapse.    Review of records significant for: Has more bladder fullness, takes longer to empty. More pelvic pressure symptoms. Cystocele noted.   Urinary Symptoms: Does not leak urine.   Day time voids 9-10.  Nocturia: 1-2 times per night to void. Voiding dysfunction: she does not empty her bladder well.  does not use a catheter to empty bladder.  When urinating, she feels a weak stream, difficulty starting urine stream, and the need to urinate multiple times in a row Drinks: coffee in AM, 60oz water per day Was having some urgency, but pelvic PT did help with this.   UTIs:  0  UTI's in the last year.   Denies history of blood in urine and kidney or bladder stones  Pelvic Organ Prolapse Symptoms:                  Admits to a feeling of a bulge the vaginal area. It has been present for 1  year .  Admits to seeing a bulge.  This bulge is bothersome. It is always present, can get worse at times.  She tried pelvic PT but did not see any improvement.   Bowel Symptom: Bowel movements: 2-3 time(s) per week Stool consistency: soft  Straining: no.  Splinting: no.  Incomplete evacuation: no.  Denies accidental bowel leakage / fecal incontinence Bowel regimen: diet, stool softener, and miralax as needed   Sexual Function Sexually active: yes.  Sexual orientation:  heterosexual Pain with sex: Yes, deep in the pelvis, has discomfort due to prolapse She is using a lubricant (unsure what kind).   Pelvic Pain Admits to  pelvic pain Location: lower pelvis, dull ache Pain occurs: constant Prior pain treatment: pelvic PT Improved by: support Worsened by: full bladder, sex and sometimes no reason   Past Medical History:  Past Medical History:  Diagnosis Date   Anxiety    Atypical mole 05/27/2014   left breast-tx widershave     Past Surgical History:   Past Surgical History:  Procedure Laterality Date   MANDIBLE SURGERY     MYOMECTOMY ABDOMINAL APPROACH     RHINOPLASTY       Past OB/GYN History: OB History  Gravida Para Term Preterm AB Living  1 1 1     1   SAB IAB Ectopic Multiple Live Births          1    # Outcome Date GA Lbr Len/2nd Weight Sex Type Anes PTL Lv  1 Term      Vag-Forceps        Menopausal: Denies vaginal bleeding since menopause Last pap smear was 05/2022.  Any history of abnormal pap smears: no.   Medications: She has a current medication list which includes the following prescription(s): clobetasol cream, melatonin, nutritional supplements, valacyclovir, and zolpidem.   Allergies: Patient is allergic to sulfa antibiotics.   Social History:  Social History   Tobacco Use   Smoking status: Former    Current packs/day: 0.00    Types: Cigarettes    Quit date: 10/15/2007  Years since quitting: 15.5   Smokeless tobacco: Never  Vaping Use   Vaping status: Never Used  Substance Use Topics   Alcohol use: Yes    Alcohol/week: 0.0 - 5.0 standard drinks of alcohol    Comment: occasionally   Drug use: No    Relationship status: married She lives with her spouse.   She is employed as a Therapist, music. Regular exercise: Yes: walking, jogging, weights 3-5x week History of abuse: No  Family History:   Family History  Problem Relation Age of Onset   Hyperlipidemia Mother    Heart disease Father    Melanoma Daughter      Review of Systems: Review of Systems  Constitutional:  Negative for fever, malaise/fatigue and weight loss.  Respiratory:  Negative for  cough, shortness of breath and wheezing.   Cardiovascular:  Negative for chest pain, palpitations and leg swelling.  Gastrointestinal:  Negative for abdominal pain and blood in stool.  Genitourinary:  Negative for dysuria.  Musculoskeletal:  Negative for myalgias.  Skin:  Negative for rash.  Neurological:  Negative for dizziness and headaches.  Endo/Heme/Allergies:  Does not bruise/bleed easily.  Psychiatric/Behavioral:  Negative for depression. The patient is not nervous/anxious.      OBJECTIVE Physical Exam: Vitals:   04/13/23 0854  BP: 108/73  Pulse: 76  Weight: 159 lb (72.1 kg)  Height: 5' 3.62" (1.616 m)    Physical Exam Constitutional:      General: She is not in acute distress. Pulmonary:     Effort: Pulmonary effort is normal.  Abdominal:     General: There is no distension.     Palpations: Abdomen is soft.     Tenderness: There is no abdominal tenderness. There is no rebound.  Musculoskeletal:        General: No swelling. Normal range of motion.  Skin:    General: Skin is warm and dry.     Findings: No rash.  Neurological:     Mental Status: She is alert and oriented to person, place, and time.  Psychiatric:        Mood and Affect: Mood normal.        Behavior: Behavior normal.      GU / Detailed Urogynecologic Evaluation:  Pelvic Exam: Normal external female genitalia; Bartholin's and Skene's glands normal in appearance; urethral meatus normal in appearance, no urethral masses or discharge.   CST: negative  Speculum exam reveals normal vaginal mucosa with atrophy. Cervix normal appearance. Uterus normal single, nontender. Adnexa no mass, fullness, tenderness.     Pelvic floor strength I/V  Pelvic floor musculature: Right levator non-tender, Right obturator non-tender, Left levator non-tender, Left obturator non-tender  POP-Q:   POP-Q  0                                            Aa   0                                           Ba  -4.5  C   3                                            Gh  4.5                                            Pb  8                                            tvl   -1                                            Ap  -1                                            Bp  -7.5                                              D      Rectal Exam:  Normal external rectum  Post-Void Residual (PVR) by Bladder Scan: In order to evaluate bladder emptying, we discussed obtaining a postvoid residual and she agreed to this procedure.  Procedure: The ultrasound unit was placed on the patient's abdomen in the suprapubic region after the patient had voided. A PVR of 0 ml was obtained by bladder scan.  Laboratory Results: POC urine: negative   ASSESSMENT AND PLAN Ms. Moninger is a 57 y.o. with:  1. Uterovaginal prolapse, incomplete   2. Urinary frequency   3. Dyspareunia, female    Stage II anterior, Stage I posterior, Stage I apical prolapse - For treatment of pelvic organ prolapse, we discussed options for management including expectant management, conservative management, and surgical management, such as Kegels, a pessary, pelvic floor physical therapy, and specific surgical procedures. - We discussed two options for prolapse repair:  1) vaginal repair without mesh - Pros - safer, no mesh complications - Cons - not as strong as mesh repair, higher risk of recurrence  2) laparoscopic repair with mesh - Pros - stronger, better long-term success - Cons - risks of mesh implant (erosion into vagina or bladder, adhering to the rectum, pain) - these risks are lower than with a vaginal mesh but still exist - She is interested in sacrocolpopexy, but not until next summer since she has just started a new job.  - Will have her undergo urodynamic testing to assess bladder emptying and occult incontinence.   2. Dyspareunia - recommended silicone based lubricant. Can also use  coconut oil or vitamin E for moisture/ lubrication  Return Feb 2025 for urodynamics and follow up  Marguerita Beards, MD

## 2023-04-13 NOTE — Patient Instructions (Signed)
   Vulvovaginal moisturizer Options: Vitamin E oil (pump or capsule) or cream (Gene's Vit E Cream) Coconut oil Silicone-based lubricant for use during intercourse ("uberlube" or "wet platinum" is a brand available at most drugstores) Crisco Consider the ingredients of the product - the fewer the ingredients the better!  Directions for Use: Clean and dry your hands Gently dab the vulvar/vaginal area dry as needed Apply a "pea-sized" amount of the moisturizer onto your fingertip Using you other hand, open the labia  Apply the moisturizer to the vulvar/vaginal tissues Wear loose fitting underwear/clothing if possible following application Use moisturize up to 3 times daily as desired.

## 2023-06-14 ENCOUNTER — Telehealth: Payer: 59 | Admitting: Physician Assistant

## 2023-06-14 DIAGNOSIS — H109 Unspecified conjunctivitis: Secondary | ICD-10-CM | POA: Diagnosis not present

## 2023-06-14 MED ORDER — MOXIFLOXACIN HCL 0.5 % OP SOLN: 1.0000 [drp] | Freq: Three times a day (TID) | OPHTHALMIC | 0 refills | Status: DC

## 2023-06-14 NOTE — Patient Instructions (Signed)
Carol Sutton, thank you for joining Margaretann Loveless, PA-C for today's virtual visit.  While this provider is not your primary care provider (PCP), if your PCP is located in our provider database this encounter information will be shared with them immediately following your visit.   A [facility] MyChart account gives you access to today's visit and all your visits, tests, and labs performed at [facility] " click here if you don't have a [facility] MyChart account or go to [email or web address]  Consent: (Patient) Carol Sutton provided verbal consent for this virtual visit at the beginning of the encounter.  Current Medications:  Current Outpatient Medications:    clobetasol cream (TEMOVATE) 0.05 %, Apply 1 application topically 2 (two) times daily., Disp: 45 g, Rfl: 6   Melatonin 1 MG CHEW, 5 mg. Pt takes 1 1/3 tabs, Disp: , Rfl:    moxifloxacin (VIGAMOX) 0.5 % ophthalmic solution, Place 1 drop into the left eye 3 (three) times daily. For 5 days, Disp: 3 mL, Rfl: 0   Nutritional Supplements (ESTROVEN PM PO), Take by mouth., Disp: , Rfl:    valACYclovir (VALTREX) 1000 MG tablet, SMARTSIG:2 Tablet(s) By Mouth Every 12 Hours PRN, Disp: , Rfl:    zolpidem (AMBIEN) 10 MG tablet, Take 10 mg by mouth at bedtime as needed., Disp: , Rfl:    Medications ordered in this encounter:  Meds ordered this encounter  Medications   moxifloxacin (VIGAMOX) 0.5 % ophthalmic solution    Sig: Place 1 drop into the left eye 3 (three) times daily. For 5 days    Dispense:  3 mL    Refill:  0    Order Specific Question:   Supervising Provider    Answer:   Merrilee Jansky [[id number]]     *If you need refills on other medications prior to your next appointment, please contact your pharmacy*  Follow-Up: Call back or seek an in-person evaluation if the symptoms worsen or if the condition fails to improve as anticipated.  [facility] [phone]  Other Instructions Bacterial  Conjunctivitis, Adult Bacterial conjunctivitis is an infection of the clear membrane that covers the white part of the eye and the inner surface of the eyelid (conjunctiva). When the blood vessels in the conjunctiva become inflamed, the eye becomes red or pink. The eye often feels irritated or itchy. Bacterial conjunctivitis spreads easily from person to person (is contagious). It also spreads easily from one eye to the other eye. What are the causes? This condition is caused by bacteria. You may get the infection if you come into close contact with: A person who is infected with the bacteria. Items that are contaminated with the bacteria, such as a face towel, contact lens solution, or eye makeup. What increases the risk? You are more likely to develop this condition if: You are exposed to other people who have the infection. You wear contact lenses. You have a sinus infection. You have had a recent eye injury or surgery. You have a weak body defense system (immune system). You have a medical condition that causes dry eyes. What are the signs or symptoms? Symptoms of this condition include: Thick, yellowish discharge from the eye. This may turn into a crust on the eyelid overnight and cause your eyelids to stick together. Tearing or watery eyes. Itchy eyes. Burning feeling in your eyes. Eye redness. Swollen eyelids. Blurred vision. How is this diagnosed? This condition is diagnosed based on your symptoms and medical history. Your  health care provider may also take a sample of discharge from your eye to find the cause of your infection. How is this treated? This condition may be treated with: Antibiotic eye drops or ointment to clear the infection more quickly and prevent the spread of infection to others. Antibiotic medicines taken by mouth (orally) to treat infections that do not respond to drops or ointments or that last longer than 10 days. Cool, wet cloths (cool compresses) placed  on the eyes. Artificial tears applied 2-6 times a day. Follow these instructions at home: Medicines Take or apply your antibiotic medicine as told by your health care provider. Do not stop using the antibiotic, even if your condition improves, unless directed by your health care provider. Take or apply over-the-counter and prescription medicines only as told by your health care provider. Be very careful to avoid touching the edge of your eyelid with the eye-drop bottle or the ointment tube when you apply medicines to the affected eye. This will keep you from spreading the infection to your other eye or to other people. Managing discomfort Gently wipe away any drainage from your eye with a warm, wet washcloth or a cotton ball. Apply a clean, cool compress to your eye for 10-20 minutes, 3-4 times a day. General instructions Do not wear contact lenses until the inflammation is gone and your health care provider says it is safe to wear them again. Ask your health care provider how to sterilize or replace your contact lenses before you use them again. Wear glasses until you can resume wearing contact lenses. Avoid wearing eye makeup until the inflammation is gone. Throw away any old eye cosmetics that may be contaminated. Change or wash your pillowcase every day. Do not share towels or washcloths. This may spread the infection. Wash your hands often with soap and water for at least 20 seconds and especially before touching your face or eyes. Use paper towels to dry your hands. Avoid touching or rubbing your eyes. Do not drive or use heavy machinery if your vision is blurred. Contact a health care provider if: You have a fever. Your symptoms do not get better after 10 days. Get help right away if: You have a fever and your symptoms suddenly get worse. You have severe pain when you move your eye. You have facial pain, redness, or swelling. You have a sudden loss of vision. Summary Bacterial  conjunctivitis is an infection of the clear membrane that covers the white part of the eye and the inner surface of the eyelid (conjunctiva). Bacterial conjunctivitis spreads easily from eye to eye and from person to person (is contagious). Wash your hands often with soap and water for at least 20 seconds and especially before touching your face or eyes. Use paper towels to dry your hands. Take or apply your antibiotic medicine as told by your health care provider. Do not stop using the antibiotic even if your condition improves. Contact a health care provider if you have a fever or if your symptoms do not get better after 10 days. Get help right away if you have a sudden loss of vision. This information is not intended to replace advice given to you by your health care provider. Make sure you discuss any questions you have with your health care provider. Document Revised: 11/05/2020 Document Reviewed: 11/05/2020 Elsevier Patient Education  2024 [employer].    If you have been instructed to have an in-person evaluation today at a local Urgent Care facility,  please use the link below. It will take you to a list of all of our available [facility], including address, phone number and hours of operation. Please do not delay care.  [facility]  If you or a family member do not have a primary care provider, use the link below to schedule a visit and establish care. When you choose a [facility] primary care physician or advanced practice provider, you gain a long-term partner in health. Find a Primary Care Provider  Learn more about [facility]'s in-office and virtual care options: [facility] - Get Care Now

## 2023-06-14 NOTE — Progress Notes (Signed)
Virtual Visit Consent   Carol Sutton, you are scheduled for a virtual visit with a [facility] provider today. Just as with appointments in the office, your consent must be obtained to participate. Your consent will be active for this visit and any virtual visit you may have with one of our providers in the next 365 days. If you have a MyChart account, a copy of this consent can be sent to you electronically.  As this is a virtual visit, video technology does not allow for your provider to perform a traditional examination. This may limit your provider's ability to fully assess your condition. If your provider identifies any concerns that need to be evaluated in person or the need to arrange testing (such as labs, EKG, etc.), we will make arrangements to do so. Although advances in technology are sophisticated, we cannot ensure that it will always work on either your end or our end. If the connection with a video visit is poor, the visit may have to be switched to a telephone visit. With either a video or telephone visit, we are not always able to ensure that we have a secure connection.  By engaging in this virtual visit, you consent to the provision of healthcare and authorize for your insurance to be billed (if applicable) for the services provided during this visit. Depending on your insurance coverage, you may receive a charge related to this service.  I need to obtain your verbal consent now. Are you willing to proceed with your visit today? Carol Sutton has provided verbal consent on 06/14/2023 for a virtual visit (video or telephone). Margaretann Loveless, PA-C  Date: 06/14/2023 8:30 AM  Virtual Visit via Video Note   I, Margaretann Loveless, connected with  Carol Sutton  ([id number], 05-30-1966) on 06/14/23 at  8:30 AM EST by a video-enabled telemedicine application and verified that I am speaking with the correct person using two identifiers.  Location: Patient: Virtual Visit Location Patient:  Home Provider: Virtual Visit Location Provider: Home Office   I discussed the limitations of evaluation and management by telemedicine and the availability of in person appointments. The patient expressed understanding and agreed to proceed.    History of Present Illness: Carol Sutton is a 57 y.o. who identifies as a female who was assigned female at birth, and is being seen today for left eye irritation.  HPI: Eye Problem  The left eye is affected. This is a new problem. The current episode started yesterday. The problem occurs constantly. The problem has been gradually worsening. The injury mechanism was a foreign body and contact lenses. The pain is mild. There is No known exposure to pink eye. She Wears contacts. Associated symptoms include an eye discharge (purulent), eye redness and a foreign body sensation. Pertinent negatives include no blurred vision, double vision, fever, itching, nausea, photophobia, recent URI or vomiting. She has tried eye drops for the symptoms. The treatment provided no relief.     Problems: There are no problems to display for this patient.   Allergies:  Allergies  Allergen Reactions   Sulfa Antibiotics Swelling and Rash   Medications:  Current Outpatient Medications:    clobetasol cream (TEMOVATE) 0.05 %, Apply 1 application topically 2 (two) times daily., Disp: 45 g, Rfl: 6   Melatonin 1 MG CHEW, 5 mg. Pt takes 1 1/3 tabs, Disp: , Rfl:    moxifloxacin (VIGAMOX) 0.5 % ophthalmic solution, Place 1 drop into the left eye 3 (three) times daily. For 5  days, Disp: 3 mL, Rfl: 0   Nutritional Supplements (ESTROVEN PM PO), Take by mouth., Disp: , Rfl:    valACYclovir (VALTREX) 1000 MG tablet, SMARTSIG:2 Tablet(s) By Mouth Every 12 Hours PRN, Disp: , Rfl:    zolpidem (AMBIEN) 10 MG tablet, Take 10 mg by mouth at bedtime as needed., Disp: , Rfl:   Observations/Objective: Patient is well-developed, well-nourished in no acute distress.  Resting comfortably at home.   Head is normocephalic, atraumatic.  No labored breathing.  Speech is clear and coherent with logical content.  Patient is alert and oriented at baseline.  Left eye is injected with mild erythema surrounding  Assessment and Plan: 1. Bacterial conjunctivitis of left eye - moxifloxacin (VIGAMOX) 0.5 % ophthalmic solution; Place 1 drop into the left eye 3 (three) times daily. For 5 days  Dispense: 3 mL; Refill: 0  - Suspect bacterial conjunctivitis - Vigamox prescribed - Warm compresses - Good hand hygiene - Seek in person evaluation if symptoms worsen or fail to improve   Follow Up Instructions: I discussed the assessment and treatment plan with the patient. The patient was provided an opportunity to ask questions and all were answered. The patient agreed with the plan and demonstrated an understanding of the instructions.  A copy of instructions were sent to the patient via MyChart unless otherwise noted below.    The patient was advised to call back or seek an in-person evaluation if the symptoms worsen or if the condition fails to improve as anticipated.    Margaretann Loveless, PA-C

## 2023-09-14 ENCOUNTER — Telehealth: Payer: Self-pay | Admitting: Obstetrics and Gynecology

## 2023-09-14 NOTE — Telephone Encounter (Signed)
 Called patient today to schedule urodynamics and follow up before proceeding with scheduling surgery.  She said that at this time, there is no change and she would like to hold off scheduling surgery for now.  She understands that urodynamics has to be performed before setting up surgery, and she will give us  enough time in advance to accommodate if she plans to go that route in the future.

## 2024-02-01 ENCOUNTER — Encounter: Admitting: Obstetrics and Gynecology

## 2024-02-15 ENCOUNTER — Ambulatory Visit: Admitting: Obstetrics and Gynecology

## 2024-02-15 ENCOUNTER — Ambulatory Visit (INDEPENDENT_AMBULATORY_CARE_PROVIDER_SITE_OTHER): Admitting: Obstetrics and Gynecology

## 2024-02-15 ENCOUNTER — Encounter: Payer: Self-pay | Admitting: Obstetrics and Gynecology

## 2024-02-15 VITALS — BP 119/82 | HR 66

## 2024-02-15 DIAGNOSIS — R350 Frequency of micturition: Secondary | ICD-10-CM

## 2024-02-15 DIAGNOSIS — N393 Stress incontinence (female) (male): Secondary | ICD-10-CM

## 2024-02-15 DIAGNOSIS — N812 Incomplete uterovaginal prolapse: Secondary | ICD-10-CM

## 2024-02-15 LAB — POCT URINALYSIS DIP (CLINITEK)
Bilirubin, UA: NEGATIVE
Blood, UA: NEGATIVE
Glucose, UA: NEGATIVE mg/dL
Ketones, POC UA: NEGATIVE mg/dL
Leukocytes, UA: NEGATIVE
Nitrite, UA: NEGATIVE
POC PROTEIN,UA: NEGATIVE
Spec Grav, UA: 1.01 (ref 1.010–1.025)
Urobilinogen, UA: 0.2 U/dL
pH, UA: 6 (ref 5.0–8.0)

## 2024-02-15 NOTE — Progress Notes (Addendum)
 [facility] Procedure  Referring Physician: Teresa Channel, MD Date of Procedure: 02/15/2024  Carol Sutton is a 58 y.o. female who presents for urodynamic evaluation. Indication(s) for study: occult SUI  Vital Signs: BP 119/82   Pulse 66   Laboratory Results: A catheterized urine specimen revealed:  POC urine:  Lab Results  Component Value Date   COLORU yellow 02/15/2024   CLARITYU clear 02/15/2024   GLUCOSEUR negative 02/15/2024   BILIRUBINUR negative 02/15/2024   KETONESU Negative 04/13/2023   SPECGRAV 1.010 02/15/2024   RBCUR negative 02/15/2024   PHUR 6.0 02/15/2024   PROTEINUR Negative 04/13/2023   UROBILINOGEN 0.2 02/15/2024   LEUKOCYTESUR Negative 02/15/2024     Voiding Diary: Deferred  Procedure Timeout:  The correct patient was verified and the correct procedure was verified. The patient was in the correct position and safety precautions were reviewed based on at the patient's history.  Urodynamic Procedure A 49F dual lumen urodynamics catheter was placed under sterile conditions into the patient's bladder. A 49F catheter was placed into the rectum in order to measure abdominal pressure. EMG patches were placed in the appropriate position.  All connections were confirmed and calibrations/adjusted made. Saline was instilled into the bladder through the dual lumen catheters.  Cough/valsalva pressures were measured periodically during filling.  Patient was allowed to void.  The bladder was then emptied of its residual.  UROFLOW: Revealed a Qmax of 17 mL/sec.  She voided 252 mL and had a residual of 10 mL.  It was a prolonged normal pattern and represented normal habits.   CMG: This was performed with sterile water in the sitting position at a fill rate of 30 mL/min.    First sensation of fullness was 13 mLs,  First urge was 111 mLs,  Strong urge was 323 mLs and  Capacity was 537 mLs  Stress incontinence was demonstrated Highest positive  Barrier CLPP was 102 cmH20 at 203 ml. Highest positive Barrier VLPP was 58 cmH20 at 203 ml.  Detrusor function was normal, with no phasic contractions seen.    Compliance:  Normal. End fill detrusor pressure was 0cmH20.    UPP: MUCP with barrier reduction was 29 cm of water.    MICTURITION STUDY: Voiding was performed with reduction using scopettes in the sitting position.  Pdet at Qmax was 0 cm of water.  Qmax was 18 mL/sec.  It was a normal pattern.  She voided 517 mL and had a residual of 20 mL.  It was a volitional void, sustained detrusor contraction was present and abdominal straining was not present  EMG: This was performed with patches.  She had voluntary contractions, recruitment with fill was not present and urethral sphincter was relaxed with void.  The details of the procedure with the study tracings have been scanned into EPIC.   Urodynamic Impression:  1. Sensation was increased; capacity was normal 2. Stress Incontinence was demonstrated at normal pressures; 3. Detrusor Overactivity was not demonstrated. 4. Emptying was normal with a normal PVR, a sustained detrusor contraction present,  abdominal straining not present, normal urethral sphincter activity on EMG.  Plan: - The patient will follow up  to discuss the findings and treatment options.  -Information given to patient between sling and Urethral bulking. She is planning on a sacrocolpopexy with Dr. Marilynne and is leaning towards a sling to treat her leakage.

## 2024-02-15 NOTE — Addendum Note (Signed)
 Addended by: Dailey Alberson G on: 02/15/2024 01:20 PM   Modules accepted: Orders

## 2024-02-29 ENCOUNTER — Ambulatory Visit: Admitting: Obstetrics and Gynecology

## 2024-03-02 ENCOUNTER — Ambulatory Visit: Admitting: Obstetrics and Gynecology

## 2024-03-09 DIAGNOSIS — H1012 Acute atopic conjunctivitis, left eye: Secondary | ICD-10-CM | POA: Diagnosis not present

## 2024-03-09 DIAGNOSIS — G4700 Insomnia, unspecified: Secondary | ICD-10-CM | POA: Diagnosis not present

## 2024-03-16 ENCOUNTER — Ambulatory Visit: Admitting: Obstetrics and Gynecology

## 2024-05-17 ENCOUNTER — Ambulatory Visit (INDEPENDENT_AMBULATORY_CARE_PROVIDER_SITE_OTHER): Admitting: Obstetrics and Gynecology

## 2024-05-17 ENCOUNTER — Encounter: Payer: Self-pay | Admitting: Obstetrics and Gynecology

## 2024-05-17 VITALS — BP 98/68 | HR 62

## 2024-05-17 DIAGNOSIS — N812 Incomplete uterovaginal prolapse: Secondary | ICD-10-CM

## 2024-05-17 DIAGNOSIS — N393 Stress incontinence (female) (male): Secondary | ICD-10-CM

## 2024-05-17 NOTE — Progress Notes (Signed)
 [facility] Return Visit  SUBJECTIVE  History of Present Illness: Yesena Reaves is a 58 y.o. female seen in follow-up for prolapse. She is interested in surgery and underwent urodynamic testing.   Urodynamic Impression:  1. Sensation was increased; capacity was normal 2. Stress Incontinence was demonstrated at normal pressures; 3. Detrusor Overactivity was not demonstrated. 4. Emptying was normal with a normal PVR, a sustained detrusor contraction present,  abdominal straining not present, normal urethral sphincter activity on EMG.  Past Medical History: Patient  has a past medical history of Anxiety and Atypical mole (05/27/2014).   Past Surgical History: She  has a past surgical history that includes Myomectomy abdominal approach; Mandible surgery; and Rhinoplasty.   Medications: She has a current medication list which includes the following prescription(s): clobetasol  cream, melatonin, nutritional supplements, tretinoin , valacyclovir, and zolpidem.   Allergies: Patient is allergic to sulfa antibiotics.   Social History: Patient  reports that she quit smoking about 16 years ago. Her smoking use included cigarettes. She has never used smokeless tobacco. She reports current alcohol use. She reports that she does not use drugs.     OBJECTIVE     Physical Exam: Vitals:   05/17/24 0838  BP: 98/68  Pulse: 62   Gen: No apparent distress, A&O x 3.  Detailed Urogynecologic Evaluation:  Deferred. Prior exam showed:  POP-Q   0                                            Aa   0                                           Ba   -4.5                                              C    3                                            Gh   4.5                                            Pb   8                                            tvl    -1                                            Ap   -1  Bp   -7.5                                               D        ASSESSMENT AND PLAN    Ms. Rabold is a 58 y.o. with:  1. Uterovaginal prolapse, incomplete   2. SUI (stress urinary incontinence, female)     Plan for surgery: Exam under anesthesia, robotic assisted total laparoscopic hysterectomy with bilateral salpingectomy, sacrocolpopexy, midurethral sling, cystoscopy, possible posterior repair  - We reviewed the patient's specific anatomic and functional findings, with the assistance of diagrams, and together finalized the above procedure. The planned surgical procedures were discussed along with the surgical risks outlined below, which were also provided on a detailed handout. Additional treatment options including expectant management, conservative management, medical management were discussed where appropriate.  We reviewed the benefits and risks of each treatment option.   General Surgical Risks: For all procedures, there are risks of bleeding, infection, damage to surrounding organs including but not limited to bowel, bladder, blood vessels, ureters and nerves, and need for further surgery if an injury were to occur. These risks are all low with minimally invasive surgery.   There are risks of numbness and weakness at any body site or buttock/rectal pain.  It is possible that baseline pain can be worsened by surgery, either with or without mesh. If surgery is vaginal, there is also a low risk of possible conversion to laparoscopy or open abdominal incision where indicated. Very rare risks include blood transfusion, blood clot, heart attack, pneumonia, or death.   There is also a risk of short-term postoperative urinary retention with need to use a catheter. About half of patients need to go home from surgery with a catheter, which is then later removed in the office. The risk of long-term need for a catheter is very low. There is also a risk of worsening of overactive bladder.   Sling: The effectiveness of a  midurethral vaginal mesh sling is approximately 85%, and thus, there will be times when you may leak urine after surgery, especially if your bladder is full or if you have a strong cough. There is a balance between making the sling tight enough to treat your leakage but not too tight so that you have long-term difficulty emptying your bladder. A mesh sling will not directly treat overactive bladder/urge incontinence and may worsen it.  There is an FDA safety notification on vaginal mesh procedures for prolapse but NOT mesh slings. We have extensive experience and training with mesh placement and we have close postoperative follow up to identify any potential complications from mesh. It is important to realize that this mesh is a permanent implant that cannot be easily removed. There are rare risks of mesh exposure (2-4%), pain with intercourse (0-7%), and infection (<1%). The risk of mesh exposure if more likely in a woman with risks for poor healing (prior radiation, poorly controlled diabetes, or immunocompromised). The risk of new or worsened chronic pain after mesh implant is more common in women with baseline chronic pain and/or poorly controlled anxiety or depression. Approximately 2-4% of patients will experience longer-term post-operative voiding dysfunction that may require surgical revision of the sling. We also reviewed that postoperatively, her stream may not be as strong as before surgery.   Prolapse (with or without mesh): Risk factors for  surgical failure  include things that put pressure on your pelvis and the surgical repair, including obesity, chronic cough, and heavy lifting or straining (including lifting children or adults, straining on the toilet, or lifting heavy objects such as furniture or anything weighing >25 lbs. Risks of recurrence is 20-30% with vaginal native tissue repair and a less than 10% with sacrocolpopexy with mesh.    Sacrocolpopexy: Mesh implants may provide more  prolapse support, but do have some unique risks to consider. It is important to understand that mesh is permanent and cannot be easily removed. Risks of abdominal sacrocolpopexy mesh include mesh exposure (~3-6%), painful intercourse (recent studies show lower rates after surgery compared to before, with ~5-8% risk of new onset), and very rare risks of bowel or bladder injury or infection (<1%). The risk of mesh exposure is more likely in a woman with risks for poor healing (prior radiation, poorly controlled diabetes, or immunocompromised). The risk of new or worsened chronic pain after mesh implant is more common in women with baseline chronic pain and/or poorly controlled anxiety or depression. There is an FDA safety notification on vaginal mesh procedures for prolapse but NOT abdominal mesh procedures and therefore does not apply to your surgery. We have extensive experience and training with mesh placement and we have close postoperative follow up to identify any potential complications from mesh.    - For preop Visit:  She is required to have a visit within 30 days of her surgery.   - Medical clearance: not required  - Anticoagulant use: No - Medicaid Hysterectomy form: no - Accepts blood transfusion: Yes - Expected length of stay: outpatient  Request sent for surgery scheduling.   [name], MD

## 2024-05-17 NOTE — Patient Instructions (Signed)
 Plan for surgery: Robotic total hysterectomy, with removal of fallopian tubes, sacrocolpopexy (mesh), cystoscopy, midurethral sling, possible posterior posterior repair

## 2024-05-22 ENCOUNTER — Encounter: Payer: Self-pay | Admitting: *Deleted

## 2024-07-26 ENCOUNTER — Telehealth: Admitting: Family Medicine

## 2024-07-26 DIAGNOSIS — J019 Acute sinusitis, unspecified: Secondary | ICD-10-CM

## 2024-07-26 DIAGNOSIS — B9689 Other specified bacterial agents as the cause of diseases classified elsewhere: Secondary | ICD-10-CM | POA: Diagnosis not present

## 2024-07-26 MED ORDER — PROMETHAZINE-DM 6.25-15 MG/5ML PO SYRP: 5.0000 mL | ORAL_SOLUTION | Freq: Four times a day (QID) | ORAL | 0 refills | Status: DC | PRN

## 2024-07-26 MED ORDER — BENZONATATE 100 MG PO CAPS: 100.0000 mg | ORAL_CAPSULE | Freq: Three times a day (TID) | ORAL | 0 refills | Status: DC | PRN

## 2024-07-26 MED ORDER — AZITHROMYCIN 250 MG PO TABS: ORAL_TABLET | ORAL | 0 refills | Status: AC

## 2024-07-26 NOTE — Patient Instructions (Signed)
 Carol Sutton, thank you for joining [name], NP for today's virtual visit.  While this provider is not your primary care provider (PCP), if your PCP is located in our provider database this encounter information will be shared with them immediately following your visit.   A [facility] MyChart account gives you access to today's visit and all your visits, tests, and labs performed at [facility]  click here if you don't have a [facility] MyChart account or go to [email or web address]  Consent: (Patient) Carol Sutton provided verbal consent for this virtual visit at the beginning of the encounter.  Current Medications:  Current Outpatient Medications:    azithromycin  (ZITHROMAX ) 250 MG tablet, Take 2 tablets on day 1, then 1 tablet daily on days 2 through 5, Disp: 6 tablet, Rfl: 0   benzonatate  (TESSALON ) 100 MG capsule, Take 1-2 capsules (100-200 mg total) by mouth 3 (three) times daily as needed for cough., Disp: 30 capsule, Rfl: 0   promethazine -dextromethorphan (PROMETHAZINE -DM) 6.25-15 MG/5ML syrup, Take 5 mLs by mouth 4 (four) times daily as needed for cough., Disp: 118 mL, Rfl: 0   clobetasol  cream (TEMOVATE ) 0.05 %, Apply 1 application topically 2 (two) times daily., Disp: 45 g, Rfl: 6   Melatonin 1 MG CHEW, 5 mg. Pt takes 1 1/3 tabs, Disp: , Rfl:    Nutritional Supplements (ESTROVEN PM PO), Take by mouth., Disp: , Rfl:    tretinoin  (RETIN-A ) 0.05 % cream, 1 application in the evening to face Externally Once a day; Duration: 30 days, Disp: , Rfl:    valACYclovir (VALTREX) 1000 MG tablet, SMARTSIG:2 Tablet(s) By Mouth Every 12 Hours PRN, Disp: , Rfl:    zolpidem (AMBIEN) 10 MG tablet, Take 10 mg by mouth at bedtime as needed., Disp: , Rfl:    Medications ordered in this encounter:  Meds ordered this encounter  Medications   azithromycin  (ZITHROMAX ) 250 MG tablet    Sig: Take 2 tablets on day 1, then 1 tablet daily on days 2 through 5    Dispense:  6 tablet     Refill:  0    Supervising Provider:   LAMPTEY, PHILIP O [[id number]]   benzonatate  (TESSALON ) 100 MG capsule    Sig: Take 1-2 capsules (100-200 mg total) by mouth 3 (three) times daily as needed for cough.    Dispense:  30 capsule    Refill:  0    Supervising Provider:   LAMPTEY, PHILIP O [[id number]]   promethazine -dextromethorphan (PROMETHAZINE -DM) 6.25-15 MG/5ML syrup    Sig: Take 5 mLs by mouth 4 (four) times daily as needed for cough.    Dispense:  118 mL    Refill:  0    Supervising Provider:   BLAISE ALEENE KIDD [[id number]]     *If you need refills on other medications prior to your next appointment, please contact your pharmacy*  Follow-Up: Call back or seek an in-person evaluation if the symptoms worsen or if the condition fails to improve as anticipated.  [facility] [phone]  Other Instructions  -Given symptoms questionable flu, outside antiviral window having increased mucus is about to have air travel providing Zithromax  to take with since going out of town Cough medication prescription strength provided - Advised for Flonase or some type of nasal spray to help with ear pressure when flying  - Increased rest - Increasing Fluids - Acetaminophen / ibuprofen as needed for fever/pain.  - Salt water gargling, chloraseptic spray and throat lozenges -  Saline nasal spray if congestion or if nasal passages feel dry. - Humidifying the air.     If you have been instructed to have an in-person evaluation today at a local Urgent Care facility, please use the link below. It will take you to a list of all of our available [facility], including address, phone number and hours of operation. Please do not delay care.  [facility]  If you or a family member do not have a primary care provider, use the link below to schedule a visit and establish care. When you choose a [facility] primary care physician or advanced practice provider, you gain  a long-term partner in health. Find a Primary Care Provider  Learn more about [facility]'s in-office and virtual care options: [facility] - Get Care Now

## 2024-07-26 NOTE — Progress Notes (Signed)
 Virtual Visit Consent   Carol Sutton, you are scheduled for a virtual visit with a [facility] provider today. Just as with appointments in the office, your consent must be obtained to participate. Your consent will be active for this visit and any virtual visit you may have with one of our providers in the next 365 days. If you have a MyChart account, a copy of this consent can be sent to you electronically.  As this is a virtual visit, video technology does not allow for your provider to perform a traditional examination. This may limit your provider's ability to fully assess your condition. If your provider identifies any concerns that need to be evaluated in person or the need to arrange testing (such as labs, EKG, etc.), we will make arrangements to do so. Although advances in technology are sophisticated, we cannot ensure that it will always work on either your end or our end. If the connection with a video visit is poor, the visit may have to be switched to a telephone visit. With either a video or telephone visit, we are not always able to ensure that we have a secure connection.  By engaging in this virtual visit, you consent to the provision of healthcare and authorize for your insurance to be billed (if applicable) for the services provided during this visit. Depending on your insurance coverage, you may receive a charge related to this service.  I need to obtain your verbal consent now. Are you willing to proceed with your visit today? Carol Sutton has provided verbal consent on 07/26/2024 for a virtual visit (video or telephone). Carol CHRISTELLA Barefoot, Carol Sutton  Date: 07/26/2024 8:47 AM   Virtual Visit via Video Note   I, Carol Sutton, connected with  Carol Sutton  ([id number], October 19, 1965) on 07/26/2024 at  8:45 AM EST by a video-enabled telemedicine application and verified that I am speaking with the correct person using two identifiers.  Location: Patient: Virtual Visit Location Patient:  Home Provider: Virtual Visit Location Provider: Home Office   I discussed the limitations of evaluation and management by telemedicine and the availability of in person appointments. The patient expressed understanding and agreed to proceed.    History of Present Illness: Carol Sutton is a 58 y.o. who identifies as a female who was assigned female at birth, and is being seen today for coughing  Onset was about a week ago chest is burning, coughing, throat is hurting, cough is productive with green yellow mucus Associated symptoms are  as stated above Modifying factors are emergency C Denies chest pain, shortness of breath, fevers, chills  Exposure to sick contacts-unknown COVID test: None Vaccines: None-recently  Problems: There are no active problems to display for this patient.   Allergies: Allergies[1] Medications: Current Medications[2]  Observations/Objective: Patient is well-developed, well-nourished in no acute distress.  Resting comfortably  at home.  Head is normocephalic, atraumatic.  No labored breathing.  Speech is clear and coherent with logical content.  Patient is alert and oriented at baseline.  Congestion tone noted Cough present  Assessment and Plan:   1. Acute bacterial sinusitis (Primary)  - azithromycin  (ZITHROMAX ) 250 MG tablet; Take 2 tablets on day 1, then 1 tablet daily on days 2 through 5  Dispense: 6 tablet; Refill: 0 - benzonatate  (TESSALON ) 100 MG capsule; Take 1-2 capsules (100-200 mg total) by mouth 3 (three) times daily as needed for cough.  Dispense: 30 capsule; Refill: 0 - promethazine -dextromethorphan (PROMETHAZINE -DM) 6.25-15 MG/5ML syrup; Take 5 mLs  by mouth 4 (four) times daily as needed for cough.  Dispense: 118 mL; Refill: 0   -Given symptoms questionable flu, outside antiviral window having increased mucus is about to have air travel providing Zithromax  to take with since going out of town Cough medication prescription strength  provided - Advised for Flonase or some type of nasal spray to help with ear pressure when flying  - Increased rest - Increasing Fluids - Acetaminophen / ibuprofen as needed for fever/pain.  - Salt water gargling, chloraseptic spray and throat lozenges - Saline nasal spray if congestion or if nasal passages feel dry. - Humidifying the air.    Reviewed side effects, risks and benefits of medication.    Patient acknowledged agreement and understanding of the plan.   Past Medical, Surgical, Social History, Allergies, and Medications have been Reviewed.   Follow Up Instructions: I discussed the assessment and treatment plan with the patient. The patient was provided an opportunity to ask questions and all were answered. The patient agreed with the plan and demonstrated an understanding of the instructions.  A copy of instructions were sent to the patient via MyChart unless otherwise noted below.    The patient was advised to call back or seek an in-person evaluation if the symptoms worsen or if the condition fails to improve as anticipated.    Carol CHRISTELLA Barefoot, Carol Sutton     [1]  Allergies Allergen Reactions   Sulfa Antibiotics Swelling and Rash  [2]  Current Outpatient Medications:    clobetasol  cream (TEMOVATE ) 0.05 %, Apply 1 application topically 2 (two) times daily., Disp: 45 g, Rfl: 6   Melatonin 1 MG CHEW, 5 mg. Pt takes 1 1/3 tabs, Disp: , Rfl:    Nutritional Supplements (ESTROVEN PM PO), Take by mouth., Disp: , Rfl:    tretinoin  (RETIN-A ) 0.05 % cream, 1 application in the evening to face Externally Once a day; Duration: 30 days, Disp: , Rfl:    valACYclovir (VALTREX) 1000 MG tablet, SMARTSIG:2 Tablet(s) By Mouth Every 12 Hours PRN, Disp: , Rfl:    zolpidem (AMBIEN) 10 MG tablet, Take 10 mg by mouth at bedtime as needed., Disp: , Rfl:

## 2024-08-07 ENCOUNTER — Ambulatory Visit: Admitting: Obstetrics and Gynecology

## 2024-08-07 ENCOUNTER — Encounter: Payer: Self-pay | Admitting: Obstetrics and Gynecology

## 2024-08-07 VITALS — BP 108/71 | HR 66

## 2024-08-07 DIAGNOSIS — Z01818 Encounter for other preprocedural examination: Secondary | ICD-10-CM

## 2024-08-07 MED ORDER — ONDANSETRON HCL 4 MG PO TABS: 4.0000 mg | ORAL_TABLET | Freq: Three times a day (TID) | ORAL | 0 refills | Status: AC | PRN

## 2024-08-07 MED ORDER — POLYETHYLENE GLYCOL 3350 17 GM/SCOOP PO POWD: 17.0000 g | Freq: Every day | ORAL | 0 refills | Status: AC

## 2024-08-07 MED ORDER — OXYCODONE HCL 5 MG PO TABS: 5.0000 mg | ORAL_TABLET | ORAL | 0 refills | Status: AC | PRN

## 2024-08-07 MED ORDER — ACETAMINOPHEN 500 MG PO TABS: 500.0000 mg | ORAL_TABLET | Freq: Four times a day (QID) | ORAL | 0 refills | Status: AC | PRN

## 2024-08-07 MED ORDER — IBUPROFEN 600 MG PO TABS: 600.0000 mg | ORAL_TABLET | Freq: Four times a day (QID) | ORAL | 0 refills | Status: AC | PRN

## 2024-08-07 NOTE — Progress Notes (Signed)
 [facility] Pre-Operative Exam  Subjective Chief Complaint: Carol Sutton presents for a preoperative encounter.   History of Present Illness: Carol Sutton is a 58 y.o. female who presents for preoperative visit.  She is scheduled to undergo Exam under anesthesia, robotic assisted total laparoscopic hysterectomy with bilateral salpingectomy, sacrocolpopexy, midurethral sling, cystoscopy, possible posterior repair  on 08/21/24.  Her symptoms include pelvic organ prolapse and stress urinary incontinence, and she was was found to have Stage II anterior, Stage II posterior, Stage I apical prolapse.   Urodynamics showed: 1. Sensation was increased; capacity was normal 2. Stress Incontinence was demonstrated at normal pressures; 3. Detrusor Overactivity was not demonstrated. 4. Emptying was normal with a normal PVR, a sustained detrusor contraction present,  abdominal straining not present, normal urethral sphincter activity on EMG.  Past Medical History:  Diagnosis Date   Anxiety    Atypical mole 05/27/2014   left breast-tx widershave     Past Surgical History:  Procedure Laterality Date   MANDIBLE SURGERY     MYOMECTOMY ABDOMINAL APPROACH     RHINOPLASTY      is allergic to sulfa antibiotics.   Family History  Problem Relation Age of Onset   Hyperlipidemia Mother    Heart disease Father    Melanoma Daughter     Social History[1]   Review of Systems was negative for a full 10 system review except as noted in the History of Present Illness.  Current Medications[2]   Objective There were no vitals filed for this visit.  Gen: NAD CV: S1 S2 RRR Lungs: Clear to auscultation bilaterally Abd: soft, nontender   Previous Pelvic Exam showed: POP-Q   0                                            Aa   0                                           Ba   -4.5                                              C    3                                            Gh   4.5                                             Pb   8                                            tvl    -1  Ap   -1                                            Bp   -7.5                                                   Assessment/ Plan  Assessment: The patient is a 58 y.o. year old scheduled to undergo Exam under anesthesia, robotic assisted total laparoscopic hysterectomy with bilateral salpingectomy, sacrocolpopexy, midurethral sling, cystoscopy, possible posterior repair. Verbal consent was obtained for these procedures.  Plan: General Surgical Consent: The patient has previously been counseled on alternative treatments, and the decision by the patient and provider was to proceed with the procedure listed above.  For all procedures, there are risks of bleeding, infection, damage to surrounding organs including but not limited to bowel, bladder, blood vessels, ureters and nerves, and need for further surgery if an injury were to occur. These risks are all low with minimally invasive surgery.   There are risks of numbness and weakness at any body site or buttock/rectal pain.  It is possible that baseline pain can be worsened by surgery, either with or without mesh. If surgery is vaginal, there is also a low risk of possible conversion to laparoscopy or open abdominal incision where indicated. Very rare risks include blood transfusion, blood clot, heart attack, pneumonia, or death.   There is also a risk of short-term postoperative urinary retention with need to use a catheter. About half of patients need to go home from surgery with a catheter, which is then later removed in the office. The risk of long-term need for a catheter is very low. There is also a risk of worsening of overactive bladder.   Sling: The effectiveness of a midurethral vaginal mesh sling is approximately 85%, and thus, there will be times when you may leak urine after surgery,  especially if your bladder is full or if you have a strong cough. There is a balance between making the sling tight enough to treat your leakage but not too tight so that you have long-term difficulty emptying your bladder. A mesh sling will not directly treat overactive bladder/urge incontinence and may worsen it.  There is an FDA safety notification on vaginal mesh procedures for prolapse but NOT mesh slings. We have extensive experience and training with mesh placement and we have close postoperative follow up to identify any potential complications from mesh. It is important to realize that this mesh is a permanent implant that cannot be easily removed. There are rare risks of mesh exposure (2-4%), pain with intercourse (0-7%), and infection (<1%). The risk of mesh exposure if more likely in a woman with risks for poor healing (prior radiation, poorly controlled diabetes, or immunocompromised). The risk of new or worsened chronic pain after mesh implant is more common in women with baseline chronic pain and/or poorly controlled anxiety or depression. Approximately 2-4% of patients will experience longer-term post-operative voiding dysfunction that may require surgical revision of the sling. We also reviewed that postoperatively, her stream may not be as strong as before surgery.    Prolapse (with or without mesh): Risk factors for surgical failure  include things that put pressure on  your pelvis and the surgical repair, including obesity, chronic cough, and heavy lifting or straining (including lifting children or adults, straining on the toilet, or lifting heavy objects such as furniture or anything weighing >25 lbs. Risks of recurrence is 20-30% with vaginal native tissue repair and a less than 10% with sacrocolpopexy with mesh.    Sacrocolpopexy: Mesh implants may provide more prolapse support, but do have some unique risks to consider. It is important to understand that mesh is permanent and cannot be  easily removed. Risks of abdominal sacrocolpopexy mesh include mesh exposure (~3-6%), painful intercourse (recent studies show lower rates after surgery compared to before, with ~5-8% risk of new onset), and very rare risks of bowel or bladder injury or infection (<1%). The risk of mesh exposure is more likely in a woman with risks for poor healing (prior radiation, poorly controlled diabetes, or immunocompromised). The risk of new or worsened chronic pain after mesh implant is more common in women with baseline chronic pain and/or poorly controlled anxiety or depression. There is an FDA safety notification on vaginal mesh procedures for prolapse but NOT abdominal mesh procedures and therefore does not apply to your surgery. We have extensive experience and training with mesh placement and we have close postoperative follow up to identify any potential complications from mesh.    We discussed consent for blood products. Risks for blood transfusion include allergic reactions, other reactions that can affect different body organs and managed accordingly, transmission of infectious diseases such as HIV or Hepatitis. However, the blood is screened. Patient consents for blood products.  Pre-operative instructions:  She was instructed to not take Aspirin/NSAIDs x 7days prior to surgery.  Antibiotic prophylaxis was ordered as indicated.  Catheter use: Patient will go home with foley if needed after post-operative voiding trial.  Post-operative instructions:  She was provided with specific post-operative instructions, including precautions and signs/symptoms for which we would recommend contacting us , in addition to daytime and after-hours contact phone numbers. This was provided on a handout.   Post-operative medications: Prescriptions for motrin, tylenol, miralax, and oxycodone were sent to her pharmacy. Discussed using ibuprofen and tylenol on a schedule to limit use of narcotics.   Laboratory testing:  We  will check labs: CBC and Type and Screen  Preoperative clearance:  She does not require surgical clearance.    Post-operative follow-up:  A post-operative appointment will be made for 6 weeks from the date of surgery. If she needs a post-operative nurse visit for a voiding trial, that will be set up after she leaves the hospital.    Patient will call the clinic or use MyChart should anything change or any new issues arise.   Westen Dinino G Aayansh Codispoti, NP      [1]  Social History Tobacco Use   Smoking status: Former    Current packs/day: 0.00    Types: Cigarettes    Quit date: 10/15/2007    Years since quitting: 16.8   Smokeless tobacco: Never  Vaping Use   Vaping status: Never Used  Substance Use Topics   Alcohol use: Yes    Alcohol/week: 0.0 - 5.0 standard drinks of alcohol    Comment: occasionally   Drug use: No  [2]  Current Outpatient Medications:    benzonatate  (TESSALON ) 100 MG capsule, Take 1-2 capsules (100-200 mg total) by mouth 3 (three) times daily as needed for cough., Disp: 30 capsule, Rfl: 0   clobetasol  cream (TEMOVATE ) 0.05 %, Apply 1 application topically 2 (two) times daily.,  Disp: 45 g, Rfl: 6   Melatonin 1 MG CHEW, 5 mg. Pt takes 1 1/3 tabs, Disp: , Rfl:    Nutritional Supplements (ESTROVEN PM PO), Take by mouth., Disp: , Rfl:    promethazine -dextromethorphan (PROMETHAZINE -DM) 6.25-15 MG/5ML syrup, Take 5 mLs by mouth 4 (four) times daily as needed for cough., Disp: 118 mL, Rfl: 0   tretinoin  (RETIN-A ) 0.05 % cream, 1 application in the evening to face Externally Once a day; Duration: 30 days, Disp: , Rfl:    valACYclovir (VALTREX) 1000 MG tablet, SMARTSIG:2 Tablet(s) By Mouth Every 12 Hours PRN, Disp: , Rfl:    zolpidem (AMBIEN) 10 MG tablet, Take 10 mg by mouth at bedtime as needed., Disp: , Rfl:

## 2024-08-07 NOTE — H&P (Signed)
 [facility] H&P  Subjective Chief Complaint: Carol Sutton presents for a preoperative encounter.   History of Present Illness: Carol Sutton is a 58 y.o. female who presents for preoperative visit.  She is scheduled to undergo Exam under anesthesia, robotic assisted total laparoscopic hysterectomy with bilateral salpingectomy, sacrocolpopexy, midurethral sling, cystoscopy, possible posterior repair  on 08/21/24.  Her symptoms include pelvic organ prolapse and stress urinary incontinence, and she was was found to have Stage II anterior, Stage II posterior, Stage I apical prolapse.   Urodynamics showed: 1. Sensation was increased; capacity was normal 2. Stress Incontinence was demonstrated at normal pressures; 3. Detrusor Overactivity was not demonstrated. 4. Emptying was normal with a normal PVR, a sustained detrusor contraction present,  abdominal straining not present, normal urethral sphincter activity on EMG.  Past Medical History:  Diagnosis Date   Anxiety    Atypical mole 05/27/2014   left breast-tx widershave     Past Surgical History:  Procedure Laterality Date   MANDIBLE SURGERY     MYOMECTOMY ABDOMINAL APPROACH     RHINOPLASTY      is allergic to sulfa antibiotics.   Family History  Problem Relation Age of Onset   Hyperlipidemia Mother    Heart disease Father    Melanoma Daughter     Social History[1]   Review of Systems was negative for a full 10 system review except as noted in the History of Present Illness.  Current Medications[2]   Objective There were no vitals filed for this visit.  Gen: NAD CV: S1 S2 RRR Lungs: Clear to auscultation bilaterally Abd: soft, nontender   Previous Pelvic Exam showed: POP-Q   0                                            Aa   0                                           Ba   -4.5                                              C    3                                            Gh   4.5                                             Pb   8                                            tvl    -1  Ap   -1                                            Bp   -7.5                                                   Assessment/ Plan  Assessment: The patient is a 58 y.o. year old scheduled to undergo Exam under anesthesia, robotic assisted total laparoscopic hysterectomy with bilateral salpingectomy, sacrocolpopexy, midurethral sling, cystoscopy, possible posterior repair. Verbal consent was obtained for these procedures.       [1]  Social History Tobacco Use   Smoking status: Former    Current packs/day: 0.00    Types: Cigarettes    Quit date: 10/15/2007    Years since quitting: 16.8   Smokeless tobacco: Never  Vaping Use   Vaping status: Never Used  Substance Use Topics   Alcohol use: Yes    Alcohol/week: 0.0 - 5.0 standard drinks of alcohol    Comment: occasionally   Drug use: No  [2] No current facility-administered medications for this encounter.  Current Outpatient Medications:    acetaminophen (TYLENOL) 500 MG tablet, Take 1 tablet (500 mg total) by mouth every 6 (six) hours as needed (pain)., Disp: 30 tablet, Rfl: 0   Cholecalciferol (VITAMIN D-3 PO), Take by mouth., Disp: , Rfl:    clobetasol  cream (TEMOVATE ) 0.05 %, Apply 1 application topically 2 (two) times daily., Disp: 45 g, Rfl: 6   ibuprofen (ADVIL) 600 MG tablet, Take 1 tablet (600 mg total) by mouth every 6 (six) hours as needed., Disp: 30 tablet, Rfl: 0   Melatonin 1 MG CHEW, 5 mg. Pt takes 1 1/3 tabs, Disp: , Rfl:    Nutritional Supplements (ESTROVEN PM PO), Take by mouth., Disp: , Rfl:    ondansetron (ZOFRAN) 4 MG tablet, Take 1 tablet (4 mg total) by mouth every 8 (eight) hours as needed for nausea or vomiting., Disp: 10 tablet, Rfl: 0   oxyCODONE (OXY IR/ROXICODONE) 5 MG immediate release tablet, Take 1 tablet (5 mg total) by mouth every 4 (four) hours as needed for severe  pain (pain score 7-10)., Disp: 15 tablet, Rfl: 0   polyethylene glycol powder (GLYCOLAX/MIRALAX) 17 GM/SCOOP powder, Take 17 g by mouth daily. Drink 17g (1 scoop) dissolved in water per day., Disp: 255 g, Rfl: 0   Red Yeast Rice Extract (RED YEAST RICE PO), Take by mouth., Disp: , Rfl:    tretinoin  (RETIN-A ) 0.05 % cream, 1 application in the evening to face Externally Once a day; Duration: 30 days, Disp: , Rfl:    valACYclovir (VALTREX) 1000 MG tablet, SMARTSIG:2 Tablet(s) By Mouth Every 12 Hours PRN, Disp: , Rfl:    zolpidem (AMBIEN) 10 MG tablet, Take 10 mg by mouth at bedtime as needed., Disp: , Rfl:

## 2024-08-07 NOTE — H&P (View-Only) (Signed)
 [facility] Pre-Operative Exam  Subjective Chief Complaint: Carol Sutton presents for a preoperative encounter.   History of Present Illness: Carol Sutton is a 58 y.o. female who presents for preoperative visit.  She is scheduled to undergo Exam under anesthesia, robotic assisted total laparoscopic hysterectomy with bilateral salpingectomy, sacrocolpopexy, midurethral sling, cystoscopy, possible posterior repair  on 08/21/24.  Her symptoms include pelvic organ prolapse and stress urinary incontinence, and she was was found to have Stage II anterior, Stage II posterior, Stage I apical prolapse.   Urodynamics showed: 1. Sensation was increased; capacity was normal 2. Stress Incontinence was demonstrated at normal pressures; 3. Detrusor Overactivity was not demonstrated. 4. Emptying was normal with a normal PVR, a sustained detrusor contraction present,  abdominal straining not present, normal urethral sphincter activity on EMG.  Past Medical History:  Diagnosis Date   Anxiety    Atypical mole 05/27/2014   left breast-tx widershave     Past Surgical History:  Procedure Laterality Date   MANDIBLE SURGERY     MYOMECTOMY ABDOMINAL APPROACH     RHINOPLASTY      is allergic to sulfa antibiotics.   Family History  Problem Relation Age of Onset   Hyperlipidemia Mother    Heart disease Father    Melanoma Daughter     Social History[1]   Review of Systems was negative for a full 10 system review except as noted in the History of Present Illness.  Current Medications[2]   Objective There were no vitals filed for this visit.  Gen: NAD CV: S1 S2 RRR Lungs: Clear to auscultation bilaterally Abd: soft, nontender   Previous Pelvic Exam showed: POP-Q   0                                            Aa   0                                           Ba   -4.5                                              C    3                                            Gh   4.5                                             Pb   8                                            tvl    -1  Ap   -1                                            Bp   -7.5                                                   Assessment/ Plan  Assessment: The patient is a 58 y.o. year old scheduled to undergo Exam under anesthesia, robotic assisted total laparoscopic hysterectomy with bilateral salpingectomy, sacrocolpopexy, midurethral sling, cystoscopy, possible posterior repair. Verbal consent was obtained for these procedures.  Plan: General Surgical Consent: The patient has previously been counseled on alternative treatments, and the decision by the patient and provider was to proceed with the procedure listed above.  For all procedures, there are risks of bleeding, infection, damage to surrounding organs including but not limited to bowel, bladder, blood vessels, ureters and nerves, and need for further surgery if an injury were to occur. These risks are all low with minimally invasive surgery.   There are risks of numbness and weakness at any body site or buttock/rectal pain.  It is possible that baseline pain can be worsened by surgery, either with or without mesh. If surgery is vaginal, there is also a low risk of possible conversion to laparoscopy or open abdominal incision where indicated. Very rare risks include blood transfusion, blood clot, heart attack, pneumonia, or death.   There is also a risk of short-term postoperative urinary retention with need to use a catheter. About half of patients need to go home from surgery with a catheter, which is then later removed in the office. The risk of long-term need for a catheter is very low. There is also a risk of worsening of overactive bladder.   Sling: The effectiveness of a midurethral vaginal mesh sling is approximately 85%, and thus, there will be times when you may leak urine after surgery,  especially if your bladder is full or if you have a strong cough. There is a balance between making the sling tight enough to treat your leakage but not too tight so that you have long-term difficulty emptying your bladder. A mesh sling will not directly treat overactive bladder/urge incontinence and may worsen it.  There is an FDA safety notification on vaginal mesh procedures for prolapse but NOT mesh slings. We have extensive experience and training with mesh placement and we have close postoperative follow up to identify any potential complications from mesh. It is important to realize that this mesh is a permanent implant that cannot be easily removed. There are rare risks of mesh exposure (2-4%), pain with intercourse (0-7%), and infection (<1%). The risk of mesh exposure if more likely in a woman with risks for poor healing (prior radiation, poorly controlled diabetes, or immunocompromised). The risk of new or worsened chronic pain after mesh implant is more common in women with baseline chronic pain and/or poorly controlled anxiety or depression. Approximately 2-4% of patients will experience longer-term post-operative voiding dysfunction that may require surgical revision of the sling. We also reviewed that postoperatively, her stream may not be as strong as before surgery.    Prolapse (with or without mesh): Risk factors for surgical failure  include things that put pressure on  your pelvis and the surgical repair, including obesity, chronic cough, and heavy lifting or straining (including lifting children or adults, straining on the toilet, or lifting heavy objects such as furniture or anything weighing >25 lbs. Risks of recurrence is 20-30% with vaginal native tissue repair and a less than 10% with sacrocolpopexy with mesh.    Sacrocolpopexy: Mesh implants may provide more prolapse support, but do have some unique risks to consider. It is important to understand that mesh is permanent and cannot be  easily removed. Risks of abdominal sacrocolpopexy mesh include mesh exposure (~3-6%), painful intercourse (recent studies show lower rates after surgery compared to before, with ~5-8% risk of new onset), and very rare risks of bowel or bladder injury or infection (<1%). The risk of mesh exposure is more likely in a woman with risks for poor healing (prior radiation, poorly controlled diabetes, or immunocompromised). The risk of new or worsened chronic pain after mesh implant is more common in women with baseline chronic pain and/or poorly controlled anxiety or depression. There is an FDA safety notification on vaginal mesh procedures for prolapse but NOT abdominal mesh procedures and therefore does not apply to your surgery. We have extensive experience and training with mesh placement and we have close postoperative follow up to identify any potential complications from mesh.    We discussed consent for blood products. Risks for blood transfusion include allergic reactions, other reactions that can affect different body organs and managed accordingly, transmission of infectious diseases such as HIV or Hepatitis. However, the blood is screened. Patient consents for blood products.  Pre-operative instructions:  She was instructed to not take Aspirin/NSAIDs x 7days prior to surgery.  Antibiotic prophylaxis was ordered as indicated.  Catheter use: Patient will go home with foley if needed after post-operative voiding trial.  Post-operative instructions:  She was provided with specific post-operative instructions, including precautions and signs/symptoms for which we would recommend contacting us , in addition to daytime and after-hours contact phone numbers. This was provided on a handout.   Post-operative medications: Prescriptions for motrin, tylenol, miralax, and oxycodone were sent to her pharmacy. Discussed using ibuprofen and tylenol on a schedule to limit use of narcotics.   Laboratory testing:  We  will check labs: CBC and Type and Screen  Preoperative clearance:  She does not require surgical clearance.    Post-operative follow-up:  A post-operative appointment will be made for 6 weeks from the date of surgery. If she needs a post-operative nurse visit for a voiding trial, that will be set up after she leaves the hospital.    Patient will call the clinic or use MyChart should anything change or any new issues arise.   Westen Dinino G Aayansh Codispoti, NP      [1]  Social History Tobacco Use   Smoking status: Former    Current packs/day: 0.00    Types: Cigarettes    Quit date: 10/15/2007    Years since quitting: 16.8   Smokeless tobacco: Never  Vaping Use   Vaping status: Never Used  Substance Use Topics   Alcohol use: Yes    Alcohol/week: 0.0 - 5.0 standard drinks of alcohol    Comment: occasionally   Drug use: No  [2]  Current Outpatient Medications:    benzonatate  (TESSALON ) 100 MG capsule, Take 1-2 capsules (100-200 mg total) by mouth 3 (three) times daily as needed for cough., Disp: 30 capsule, Rfl: 0   clobetasol  cream (TEMOVATE ) 0.05 %, Apply 1 application topically 2 (two) times daily.,  Disp: 45 g, Rfl: 6   Melatonin 1 MG CHEW, 5 mg. Pt takes 1 1/3 tabs, Disp: , Rfl:    Nutritional Supplements (ESTROVEN PM PO), Take by mouth., Disp: , Rfl:    promethazine -dextromethorphan (PROMETHAZINE -DM) 6.25-15 MG/5ML syrup, Take 5 mLs by mouth 4 (four) times daily as needed for cough., Disp: 118 mL, Rfl: 0   tretinoin  (RETIN-A ) 0.05 % cream, 1 application in the evening to face Externally Once a day; Duration: 30 days, Disp: , Rfl:    valACYclovir (VALTREX) 1000 MG tablet, SMARTSIG:2 Tablet(s) By Mouth Every 12 Hours PRN, Disp: , Rfl:    zolpidem (AMBIEN) 10 MG tablet, Take 10 mg by mouth at bedtime as needed., Disp: , Rfl:

## 2024-08-13 ENCOUNTER — Encounter (HOSPITAL_COMMUNITY): Payer: Self-pay | Admitting: Obstetrics and Gynecology

## 2024-08-13 NOTE — Progress Notes (Signed)
 Spoke w/ via phone for pre-op interview--- [facility] needs dos----  CBC and T&S per surgeon, preop lab appt 08/15/24 at 0900       Lab results------ COVID test -----patient states asymptomatic no test needed Arrive at -------0530 NPO after MN NO Solid Food.  Pre-Surgery Ensure or G2:  Med rec completed Medications to take morning of surgery -----NONE Diabetic medication -----  GLP1 agonist last dose: GLP1 instructions:  Patient instructed no nail polish to be worn day of surgery Patient instructed to bring photo id and insurance card day of surgery Patient aware to have Driver (ride ) / caregiver    for 24 hours after surgery - Husband Carol Sutton Patient Special Instructions -----CHG shower night before surgery. Pre-Op special Instructions -----  Patient verbalized understanding of instructions that were given at this phone interview. Patient denies chest pain, sob, fever, cough at the interview.

## 2024-08-13 NOTE — Progress Notes (Signed)
 Surgical Instructions  Your procedure is scheduled on :  Tuesday January 13th, 2026 Report to [facility] Main Entrance A at 5:30 AM, then check in the Admitting office. Any questions or running late day of surgery :  call [phone]  Questions prior to your surgery day:  call [phone], Monday -- Friday 8am - 4pm. If you experience any cold or flu symptoms such as cough, fever, chills, shortness of breath, etc. between now and you scheduled surgery, please notify your surgeon office.   Remember: Do Not eat any food after midnight the night before surgery.     This includes No water,  candy,  gum, and mints.  Take these medicines the morning of surgery with A SIPS OF WATER:  NONE     One week prior to surgery, STOP taking any Aspirin (unless otherwise instructed by your surgeon) Aleve, Naproxen, ibuprofen , Motrin , Advil , Goody's, BC's, all herbal medications/ supplements, fish oil, and non-prescription vitamins.  Do NOT Smoke (tobacco/ vaping) and Do Not drink alcohol for 24 hours prior to your procedure.  For those patients that use a CPAP.  Please bring your CPAP/ mask/ tubing with them day of surgery . Anesthesia may ask recovery room nurse to use and if you stay the night you be asked to use it.  You will be asked to removed any contacts, glasses, piercing's, hearing aid's, dentures/ partials prior to surgery.  Please bring cases/ container/ solution/ etc., for them day of surgery.   Patients discharged the day of surgery will NOT be allowed to drive home.  You must have responsible driver and caregiver to stay at home with you the next 24 hours.  SURGICAL WAITING ROOM VISITATION Patients may have no more than 2 support people in the waiting area - if more than 2 , these visitors may rotate.  Pre-op nurse will coordinate an appropriate time for 1 Adult support person, who may not rotate, to accompany patient in pre-op.  Aware some patients may have certain circumstances, speak to  pre-op nurse day of surgery.  Children under the age 60 must have an adult with them who is not the patient and must remain in the main waiting area with an adult.  If the patient needs to stay at the hospital during part of their recovery, the visitor guidelines for inpatient rooms apply.  Please refer to the [facility] website for the visitor guidelines for any additional information.  If you received a COVID test during your pre-op visit it is requested that you wear a mask when out in public, stay away from anyone that may not be feeling well and notify your surgeon if you develop symptoms.  If you have been in contact with anyone that has tested positive in the past 10 days notify your surgeon.     [facility] - Preparing for Surgery  Before surgery, you can play an important role. Because skin is not sterile, it needs to be as free of germs as possible. You can reduce the number of germs on your skin by washing with CHG (chlorhexidine gluconate) soap before surgery. CHG is an antiseptic cleaner which kills germs and bonds with the skin to continue killing germs even after washing. Oral hygiene is also important in reducing the risk of infection. Remember to brush your teeth with your regular toothpaste the morning of surgery.  Please DO NOT use if you have an allergy to CHG or antibacterial soaps. If your skin becomes reddened/irritated stop using the  CHG and inform your Pre-op nurse day of surgery.  DO NOT shave (including legs and genital area) for at least 48 hours prior to your CHG shower.   Please follow these instructions carefully:  Shower with CHG soap the night before surgery. If you choose to wash your hair, wash your hair first as usual with your normal shampoo. After you shampoo, rinse your hair and body thoroughly to remove the shampoo. Use CHG as you would any other liquid soap. You can apply CHG directly to the skin and wash gently with a clean washcloth or shower  sponge. Apply the CHG soap to your body ONLY FROM THE NECK DOWN. Do not use on open wounds or open sores. Avoid contact with your eyes, ears, mouth, and genitals (private parts). Wash genitals (private parts) with your normal soap. Wash thoroughly, paying special attention to the area where your surgery will be performed. Thoroughly rinse your body with warm water from the neck down. DO NOT shower/wash with your normal soap after using and rinsing off the CHG soap. DO NOT use lotions, oils, etc., after showering with CHG. Pat yourself dry with a clean towel. Wear clean pajamas. Place clean sheets on your bed the night of your CHG shower and do not sleep with pets.  Day of Surgery  DO NOT Apply any lotions,  powder,  oils,  deodorants (may use underarm deodorant),  cologne/  perfumes  or makeup Do Not wear jewelry /  piercing's/  metal/  permanent jewelry must be removed prior to arrival day of surgery. (No plastic piercing) Do Not wear nail polish,  gel polish,  artificial nails, or any other type of covering on natural finger nails (toe nails are okay) Remember to brush your teeth and rinse mouth out. Put on clean / comfortable clothes. [facility] is not responsible for valuables/ personal belongings

## 2024-08-15 ENCOUNTER — Other Ambulatory Visit (HOSPITAL_BASED_OUTPATIENT_CLINIC_OR_DEPARTMENT_OTHER): Payer: Self-pay | Admitting: Family Medicine

## 2024-08-15 ENCOUNTER — Encounter (HOSPITAL_COMMUNITY)
Admission: RE | Admit: 2024-08-15 | Discharge: 2024-08-15 | Disposition: A | Discharge status: Home / Self Care | Source: Ambulatory Visit | Attending: Obstetrics and Gynecology | Admitting: Obstetrics and Gynecology

## 2024-08-15 DIAGNOSIS — Z01818 Encounter for other preprocedural examination: Secondary | ICD-10-CM

## 2024-08-15 DIAGNOSIS — Z01812 Encounter for preprocedural laboratory examination: Secondary | ICD-10-CM | POA: Diagnosis present

## 2024-08-15 DIAGNOSIS — E7800 Pure hypercholesterolemia, unspecified: Secondary | ICD-10-CM

## 2024-08-15 LAB — TYPE AND SCREEN
ABO/RH(D): A NEG
Antibody Screen: NEGATIVE

## 2024-08-15 LAB — CBC
HCT: 40 % (ref 36.0–46.0)
Hemoglobin: 13.2 g/dL (ref 12.0–15.0)
MCH: 30.6 pg (ref 26.0–34.0)
MCHC: 33 g/dL (ref 30.0–36.0)
MCV: 92.8 fL (ref 80.0–100.0)
Platelets: 267 K/uL (ref 150–400)
RBC: 4.31 MIL/uL (ref 3.87–5.11)
RDW: 11.7 % (ref 11.5–15.5)
WBC: 6.7 K/uL (ref 4.0–10.5)
nRBC: 0 % (ref 0.0–0.2)

## 2024-08-20 NOTE — Anesthesia Preprocedure Evaluation (Signed)
"                                    Anesthesia Evaluation  Patient identified by MRN, date of birth, ID band Patient awake    Reviewed: Allergy & Precautions, NPO status , Patient's Chart, lab work & pertinent test results  History of Anesthesia Complications (+) PONV and history of anesthetic complications  Airway Mallampati: III  TM Distance: >3 FB Neck ROM: Full  Mouth opening: Limited Mouth Opening  Dental no notable dental hx. (+) Teeth Intact, Dental Advisory Given   Pulmonary former smoker   Pulmonary exam normal breath sounds clear to auscultation       Cardiovascular negative cardio ROS Normal cardiovascular exam Rhythm:Regular Rate:Normal     Neuro/Psych  PSYCHIATRIC DISORDERS Anxiety     negative neurological ROS     GI/Hepatic negative GI ROS, Neg liver ROS,,,  Endo/Other  negative endocrine ROS    Renal/GU negative Renal ROS  negative genitourinary   Musculoskeletal negative musculoskeletal ROS (+)    Abdominal   Peds  Hematology negative hematology ROS (+)   Anesthesia Other Findings   Reproductive/Obstetrics                              Anesthesia Physical Anesthesia Plan  ASA: 2  Anesthesia Plan: General   Post-op Pain Management: Tylenol  PO (pre-op)*, Ketamine  IV* and Toradol  IV (intra-op)*   Induction: Intravenous  PONV Risk Score and Plan: 4 or greater and Midazolam , Dexamethasone , Ondansetron  and Scopolamine  patch - Pre-op  Airway Management Planned: Oral ETT  Additional Equipment:   Intra-op Plan:   Post-operative Plan: Extubation in OR  Informed Consent: I have reviewed the patients History and Physical, chart, labs and discussed the procedure including the risks, benefits and alternatives for the proposed anesthesia with the patient or authorized representative who has indicated his/her understanding and acceptance.     Dental advisory given  Plan Discussed with: CRNA  Anesthesia  Plan Comments: (2 IVs)         Anesthesia Quick Evaluation  "

## 2024-08-21 ENCOUNTER — Other Ambulatory Visit: Payer: Self-pay

## 2024-08-21 ENCOUNTER — Ambulatory Visit (HOSPITAL_COMMUNITY): Payer: Self-pay | Admitting: Anesthesiology

## 2024-08-21 ENCOUNTER — Encounter (HOSPITAL_COMMUNITY): Payer: Self-pay | Admitting: Obstetrics and Gynecology

## 2024-08-21 ENCOUNTER — Telehealth: Payer: Self-pay | Admitting: Obstetrics and Gynecology

## 2024-08-21 ENCOUNTER — Other Ambulatory Visit: Payer: Self-pay | Admitting: Obstetrics and Gynecology

## 2024-08-21 ENCOUNTER — Encounter (HOSPITAL_COMMUNITY): Payer: Self-pay | Admitting: Anesthesiology

## 2024-08-21 ENCOUNTER — Ambulatory Visit (HOSPITAL_COMMUNITY)
Admission: RE | Admit: 2024-08-21 | Discharge: 2024-08-21 | Disposition: A | Discharge status: Home / Self Care | Attending: Obstetrics and Gynecology | Admitting: Obstetrics and Gynecology

## 2024-08-21 ENCOUNTER — Encounter (HOSPITAL_COMMUNITY)
Admission: RE | Disposition: A | Discharge status: Home / Self Care | Payer: Self-pay | Source: Home / Self Care | Attending: Obstetrics and Gynecology

## 2024-08-21 DIAGNOSIS — N812 Incomplete uterovaginal prolapse: Secondary | ICD-10-CM | POA: Diagnosis present

## 2024-08-21 DIAGNOSIS — Z87891 Personal history of nicotine dependence: Secondary | ICD-10-CM | POA: Diagnosis not present

## 2024-08-21 DIAGNOSIS — K389 Disease of appendix, unspecified: Secondary | ICD-10-CM

## 2024-08-21 DIAGNOSIS — F419 Anxiety disorder, unspecified: Secondary | ICD-10-CM | POA: Diagnosis not present

## 2024-08-21 DIAGNOSIS — N393 Stress incontinence (female) (male): Secondary | ICD-10-CM | POA: Insufficient documentation

## 2024-08-21 DIAGNOSIS — Z01818 Encounter for other preprocedural examination: Secondary | ICD-10-CM

## 2024-08-21 HISTORY — PX: RECTOCELE REPAIR: SHX761

## 2024-08-21 HISTORY — DX: Other specified postprocedural states: Z98.890

## 2024-08-21 HISTORY — PX: HYSTERECTOMY, TOTAL, LAPAROSCOPIC, ROBOT-ASSISTED WITH SALPINGECTOMY: SHX7587

## 2024-08-21 HISTORY — PX: ROBOTIC ASSISTED LAPAROSCOPIC SACROCOLPOPEXY: SHX5388

## 2024-08-21 HISTORY — PX: CYSTOSCOPY: SHX5120

## 2024-08-21 HISTORY — PX: BLADDER SUSPENSION: SHX72

## 2024-08-21 LAB — ABO/RH: ABO/RH(D): A NEG

## 2024-08-21 SURGERY — HYSTERECTOMY, TOTAL, LAPAROSCOPIC, ROBOT-ASSISTED WITH SALPINGECTOMY
Anesthesia: General | Site: Pelvis

## 2024-08-21 MED ORDER — CHLORHEXIDINE GLUCONATE 0.12 % MT SOLN
15.0000 mL | Freq: Once | OROMUCOSAL | Status: AC
  Administered 2024-08-21: 15 mL via OROMUCOSAL

## 2024-08-21 MED ORDER — GABAPENTIN 300 MG PO CAPS
ORAL_CAPSULE | ORAL | Status: AC
  Filled 2024-08-21: qty 1

## 2024-08-21 MED ORDER — PHENAZOPYRIDINE HCL 100 MG PO TABS
ORAL_TABLET | ORAL | Status: AC
  Filled 2024-08-21: qty 2

## 2024-08-21 MED ORDER — DEXAMETHASONE SOD PHOSPHATE PF 10 MG/ML IJ SOLN
INTRAMUSCULAR | Status: AC
  Filled 2024-08-21: qty 1

## 2024-08-21 MED ORDER — SODIUM CHLORIDE (PF) 0.9 % IJ SOLN
INTRAMUSCULAR | Status: AC
  Filled 2024-08-21: qty 50

## 2024-08-21 MED ORDER — ORAL CARE MOUTH RINSE: 15.0000 mL | Freq: Once | OROMUCOSAL | Status: AC

## 2024-08-21 MED ORDER — LIDOCAINE 2% (20 MG/ML) 5 ML SYRINGE
INTRAMUSCULAR | Status: AC
  Filled 2024-08-21: qty 5

## 2024-08-21 MED ORDER — ONDANSETRON HCL 4 MG/2ML IJ SOLN
INTRAMUSCULAR | Status: AC
  Filled 2024-08-21: qty 2

## 2024-08-21 MED ORDER — FENTANYL CITRATE (PF) 250 MCG/5ML IJ SOLN
INTRAMUSCULAR | Status: AC
  Filled 2024-08-21: qty 5

## 2024-08-21 MED ORDER — CEFAZOLIN SODIUM-DEXTROSE 2-4 GM/100ML-% IV SOLN
2.0000 g | INTRAVENOUS | Status: AC
  Administered 2024-08-21: 2 g via INTRAVENOUS

## 2024-08-21 MED ORDER — PROPOFOL 10 MG/ML IV BOLUS
INTRAVENOUS | Status: AC
  Filled 2024-08-21: qty 20

## 2024-08-21 MED ORDER — CEFAZOLIN SODIUM-DEXTROSE 2-4 GM/100ML-% IV SOLN
INTRAVENOUS | Status: AC
  Filled 2024-08-21: qty 100

## 2024-08-21 MED ORDER — ALBUMIN HUMAN 5 % IV SOLN
INTRAVENOUS | Status: AC
  Filled 2024-08-21: qty 250

## 2024-08-21 MED ORDER — LIDOCAINE-EPINEPHRINE 1 %-1:100000 IJ SOLN
INTRAMUSCULAR | Status: DC | PRN
  Administered 2024-08-21: 12 mL

## 2024-08-21 MED ORDER — AMISULPRIDE (ANTIEMETIC) 5 MG/2ML IV SOLN: 10.0000 mg | Freq: Once | INTRAVENOUS | Status: DC | PRN

## 2024-08-21 MED ORDER — PROPOFOL 10 MG/ML IV BOLUS
INTRAVENOUS | Status: DC | PRN
  Administered 2024-08-21: 150 mg via INTRAVENOUS

## 2024-08-21 MED ORDER — ONDANSETRON HCL 4 MG/2ML IJ SOLN
INTRAMUSCULAR | Status: DC | PRN
  Administered 2024-08-21: 4 mg via INTRAVENOUS

## 2024-08-21 MED ORDER — MIDAZOLAM HCL 5 MG/5ML IJ SOLN
INTRAMUSCULAR | Status: DC | PRN
  Administered 2024-08-21: 2 mg via INTRAVENOUS

## 2024-08-21 MED ORDER — SODIUM CHLORIDE 0.9 % IR SOLN
Status: DC | PRN
  Administered 2024-08-21: 1000 mL via INTRAVESICAL

## 2024-08-21 MED ORDER — ACETAMINOPHEN 500 MG PO TABS
1000.0000 mg | ORAL_TABLET | ORAL | Status: AC
  Administered 2024-08-21: 1000 mg via ORAL

## 2024-08-21 MED ORDER — PHENYLEPHRINE 80 MCG/ML (10ML) SYRINGE FOR IV PUSH (FOR BLOOD PRESSURE SUPPORT)
PREFILLED_SYRINGE | INTRAVENOUS | Status: DC | PRN
  Administered 2024-08-21 (×2): 160 ug via INTRAVENOUS
  Administered 2024-08-21: 80 ug via INTRAVENOUS
  Administered 2024-08-21: 160 ug via INTRAVENOUS
  Administered 2024-08-21: 80 ug via INTRAVENOUS
  Administered 2024-08-21: 160 ug via INTRAVENOUS
  Administered 2024-08-21: 80 ug via INTRAVENOUS

## 2024-08-21 MED ORDER — SUGAMMADEX SODIUM 200 MG/2ML IV SOLN
INTRAVENOUS | Status: DC | PRN
  Administered 2024-08-21: 200 mg via INTRAVENOUS

## 2024-08-21 MED ORDER — ROCURONIUM BROMIDE 10 MG/ML (PF) SYRINGE
PREFILLED_SYRINGE | INTRAVENOUS | Status: AC
  Filled 2024-08-21: qty 10

## 2024-08-21 MED ORDER — OXYCODONE HCL 5 MG/5ML PO SOLN: 5.0000 mg | Freq: Once | ORAL | Status: DC | PRN

## 2024-08-21 MED ORDER — SODIUM CHLORIDE 0.9 % IR SOLN
Status: DC | PRN
  Administered 2024-08-21: 1000 mL

## 2024-08-21 MED ORDER — KETAMINE HCL 50 MG/5ML IJ SOSY
PREFILLED_SYRINGE | INTRAMUSCULAR | Status: AC
  Filled 2024-08-21: qty 5

## 2024-08-21 MED ORDER — GABAPENTIN 300 MG PO CAPS
300.0000 mg | ORAL_CAPSULE | ORAL | Status: AC
  Administered 2024-08-21: 300 mg via ORAL

## 2024-08-21 MED ORDER — OXYCODONE HCL 5 MG PO TABS: 5.0000 mg | ORAL_TABLET | Freq: Once | ORAL | Status: DC | PRN

## 2024-08-21 MED ORDER — PHENYLEPHRINE 80 MCG/ML (10ML) SYRINGE FOR IV PUSH (FOR BLOOD PRESSURE SUPPORT)
PREFILLED_SYRINGE | INTRAVENOUS | Status: AC
  Filled 2024-08-21: qty 20

## 2024-08-21 MED ORDER — SCOPOLAMINE 1 MG/3DAYS TD PT72
MEDICATED_PATCH | TRANSDERMAL | Status: AC
  Filled 2024-08-21: qty 1

## 2024-08-21 MED ORDER — KETAMINE HCL 50 MG/5ML IJ SOSY
PREFILLED_SYRINGE | INTRAMUSCULAR | Status: DC | PRN
  Administered 2024-08-21 (×2): 10 mg via INTRAVENOUS

## 2024-08-21 MED ORDER — FENTANYL CITRATE (PF) 100 MCG/2ML IJ SOLN
INTRAMUSCULAR | Status: DC | PRN
  Administered 2024-08-21 (×3): 50 ug via INTRAVENOUS

## 2024-08-21 MED ORDER — SCOPOLAMINE 1 MG/3DAYS TD PT72
1.0000 | MEDICATED_PATCH | TRANSDERMAL | Status: DC
  Administered 2024-08-21: 1 mg via TRANSDERMAL

## 2024-08-21 MED ORDER — 0.9 % SODIUM CHLORIDE (POUR BTL) OPTIME
TOPICAL | Status: DC | PRN
  Administered 2024-08-21: 1000 mL

## 2024-08-21 MED ORDER — MIDAZOLAM HCL 2 MG/2ML IJ SOLN
INTRAMUSCULAR | Status: AC
  Filled 2024-08-21: qty 2

## 2024-08-21 MED ORDER — METRONIDAZOLE 500 MG/100ML IV SOLN
INTRAVENOUS | Status: AC
  Filled 2024-08-21: qty 100

## 2024-08-21 MED ORDER — PHENAZOPYRIDINE HCL 100 MG PO TABS
200.0000 mg | ORAL_TABLET | ORAL | Status: AC
  Administered 2024-08-21: 200 mg via ORAL

## 2024-08-21 MED ORDER — ALBUMIN HUMAN 5 % IV SOLN
12.5000 g | Freq: Once | INTRAVENOUS | Status: AC
  Administered 2024-08-21: 12.5 g via INTRAVENOUS

## 2024-08-21 MED ORDER — BUPIVACAINE HCL (PF) 0.25 % IJ SOLN
INTRAMUSCULAR | Status: DC | PRN
  Administered 2024-08-21: 14 mL

## 2024-08-21 MED ORDER — ACETAMINOPHEN 500 MG PO TABS
ORAL_TABLET | ORAL | Status: AC
  Filled 2024-08-21: qty 2

## 2024-08-21 MED ORDER — SODIUM CHLORIDE (PF) 0.9 % IJ SOLN
INTRAMUSCULAR | Status: AC
  Filled 2024-08-21: qty 20

## 2024-08-21 MED ORDER — DEXAMETHASONE SOD PHOSPHATE PF 10 MG/ML IJ SOLN
INTRAMUSCULAR | Status: DC | PRN
  Administered 2024-08-21: 6 mg via INTRAVENOUS

## 2024-08-21 MED ORDER — METRONIDAZOLE 500 MG/100ML IV SOLN
500.0000 mg | Freq: Once | INTRAVENOUS | Status: AC
  Administered 2024-08-21: 500 mg via INTRAVENOUS

## 2024-08-21 MED ORDER — LACTATED RINGERS IV SOLN: INTRAVENOUS | Status: DC

## 2024-08-21 MED ORDER — ROCURONIUM BROMIDE 100 MG/10ML IV SOLN
INTRAVENOUS | Status: DC | PRN
  Administered 2024-08-21: 100 mg via INTRAVENOUS
  Administered 2024-08-21: 40 mg via INTRAVENOUS
  Administered 2024-08-21: 20 mg via INTRAVENOUS
  Administered 2024-08-21: 30 mg via INTRAVENOUS

## 2024-08-21 MED ORDER — CHLORHEXIDINE GLUCONATE 0.12 % MT SOLN
OROMUCOSAL | Status: AC
  Filled 2024-08-21: qty 15

## 2024-08-21 MED ORDER — LIDOCAINE 2% (20 MG/ML) 5 ML SYRINGE
INTRAMUSCULAR | Status: DC | PRN
  Administered 2024-08-21: 40 mg via INTRAVENOUS
  Administered 2024-08-21: 60 mg via INTRAVENOUS

## 2024-08-21 MED ORDER — BUPIVACAINE HCL (PF) 0.25 % IJ SOLN
INTRAMUSCULAR | Status: AC
  Filled 2024-08-21: qty 30

## 2024-08-21 MED ORDER — LIDOCAINE HCL (CARDIAC) PF 100 MG/5ML IV SOSY: PREFILLED_SYRINGE | INTRAVENOUS | Status: DC | PRN

## 2024-08-21 MED ORDER — HYDROMORPHONE HCL 1 MG/ML IJ SOLN: 0.2500 mg | INTRAMUSCULAR | Status: DC | PRN

## 2024-08-21 MED ORDER — LIDOCAINE-EPINEPHRINE 1 %-1:100000 IJ SOLN
INTRAMUSCULAR | Status: AC
  Filled 2024-08-21: qty 2

## 2024-08-21 SURGICAL SUPPLY — 74 items
BAG URINE DRAIN 2000ML AR STRL (UROLOGICAL SUPPLIES) ×3 IMPLANT
BLADE CLIPPER SENSICLIP SURGIC (BLADE) ×3 IMPLANT
BLADE SURG 15 STRL LF DISP TIS (BLADE) ×3 IMPLANT
CATH FOLEY 3WAY 5CC 16FR (CATHETERS) ×3 IMPLANT
CHLORAPREP W/TINT 26 (MISCELLANEOUS) ×3 IMPLANT
COVER BACK TABLE 60X90IN (DRAPES) ×3 IMPLANT
COVER TIP SHEARS 8 DVNC (MISCELLANEOUS) ×3 IMPLANT
DEFOGGER SCOPE WARM SEASHARP (MISCELLANEOUS) ×3 IMPLANT
DERMABOND ADVANCED .7 DNX12 (GAUZE/BANDAGES/DRESSINGS) ×3 IMPLANT
DRAPE ARM DVNC X/XI (DISPOSABLE) ×12 IMPLANT
DRAPE COLUMN DVNC XI (DISPOSABLE) ×3 IMPLANT
DRAPE SHEET LG 3/4 BI-LAMINATE (DRAPES) ×3 IMPLANT
DRAPE SURG IRRIG POUCH 19X23 (DRAPES) ×3 IMPLANT
DRAPE UTILITY XL STRL (DRAPES) ×3 IMPLANT
DRIVER NDLE MEGA SUTCUT DVNCXI (INSTRUMENTS) IMPLANT
ELECTRODE REM PT RTRN 9FT ADLT (ELECTROSURGICAL) ×3 IMPLANT
FORCEPS BPLR FENES DVNC XI (FORCEP) IMPLANT
GAUZE 4X4 16PLY ~~LOC~~+RFID DBL (SPONGE) IMPLANT
GLOVE BIOGEL PI IND STRL 6.5 (GLOVE) ×3 IMPLANT
GLOVE BIOGEL PI IND STRL 7.0 (GLOVE) ×6 IMPLANT
GLOVE BIOGEL PI MICRO STRL 6 (GLOVE) ×9 IMPLANT
GLOVE SURG UNDER POLY LF SZ6.5 (GLOVE) ×12 IMPLANT
GOWN STRL REUS W/ TWL LRG LVL3 (GOWN DISPOSABLE) ×3 IMPLANT
GOWN STRL REUS W/TWL LRG LVL3 (GOWN DISPOSABLE) ×3 IMPLANT
GRASPER TIP-UP FEN DVNC XI (INSTRUMENTS) IMPLANT
HIBICLENS CHG 4% 4OZ BTL (MISCELLANEOUS) ×6 IMPLANT
HOLDER FOLEY CATH W/STRAP (MISCELLANEOUS) ×3 IMPLANT
IRRIGATION SUCT STRKRFLW 2 WTP (MISCELLANEOUS) ×3 IMPLANT
KIT PINK PAD W/HEAD ARM REST (MISCELLANEOUS) ×3 IMPLANT
KIT TURNOVER KIT B (KITS) ×3 IMPLANT
LEGGING LITHOTOMY PAIR STRL (DRAPES) ×3 IMPLANT
MANIFOLD NEPTUNE II (INSTRUMENTS) ×3 IMPLANT
MANIPULATOR ADVINCU DEL 2.5 PL (MISCELLANEOUS) IMPLANT
MANIPULATOR ADVINCU DEL 3.0 PL (MISCELLANEOUS) IMPLANT
MANIPULATOR ADVINCU DEL 3.5 PL (MISCELLANEOUS) IMPLANT
MANIPULATOR ADVINCU DEL 4.0 PL (MISCELLANEOUS) IMPLANT
MESH VERTESSA LITE -Y 2X4X3 (Mesh General) ×3 IMPLANT
NEEDLE HYPO 22X1.5 SAFETY MO (MISCELLANEOUS) ×3 IMPLANT
NEEDLE INSUFFLATION 14GA 120MM (NEEDLE) ×3 IMPLANT
OBTURATOR OPTICALSTD 8 DVNC (TROCAR) ×3 IMPLANT
PACK CYSTO (CUSTOM PROCEDURE TRAY) ×3 IMPLANT
PACK ROBOT WH (CUSTOM PROCEDURE TRAY) ×3 IMPLANT
PACK ROBOTIC GOWN (GOWN DISPOSABLE) ×3 IMPLANT
PACK VAGINAL WOMENS (CUSTOM PROCEDURE TRAY) ×3 IMPLANT
PAD OB MATERNITY 11 LF (PERSONAL CARE ITEMS) ×3 IMPLANT
PATTIES SURGICAL .5 X3 (DISPOSABLE) IMPLANT
RETRACTOR LONE STAR DISPOSABLE (INSTRUMENTS) ×3 IMPLANT
RETRACTOR STAY HOOK 5MM (MISCELLANEOUS) ×3 IMPLANT
SCISSORS MNPLR CVD DVNC XI (INSTRUMENTS) IMPLANT
SEAL UNIV 5-12 XI (MISCELLANEOUS) ×15 IMPLANT
SEALER VESSEL EXT DVNC XI (MISCELLANEOUS) IMPLANT
SET CYSTO IRRIGATION (SET/KITS/TRAYS/PACK) ×3 IMPLANT
SET IRRIG Y-TYPE CYSTO (SET/KITS/TRAYS/PACK) ×3 IMPLANT
SET TUBE SMOKE EVAC HIGH FLOW (TUBING) ×3 IMPLANT
SLEEVE SCD COMPRESS KNEE MED (STOCKING) ×3 IMPLANT
SLING ADVANTAGE FIT TRANVAG (Sling) IMPLANT
SOLN 0.9% NACL POUR BTL 1000ML (IV SOLUTION) ×3 IMPLANT
SPIKE FLUID TRANSFER (MISCELLANEOUS) ×6 IMPLANT
SUCTION TUBE FRAZIER 10FR DISP (SUCTIONS) ×3 IMPLANT
SURGIFLO W/THROMBIN 8M KIT (HEMOSTASIS) IMPLANT
SUT GORETEX NAB #0 THX26 36IN (SUTURE) ×6 IMPLANT
SUT MNCRL AB 4-0 PS2 18 (SUTURE) ×6 IMPLANT
SUT MON AB 2-0 SH 27 (SUTURE) ×3 IMPLANT
SUT VIC AB 0 CT1 27XBRD ANBCTR (SUTURE) ×3 IMPLANT
SUT VIC AB 2-0 SH 27XBRD (SUTURE) ×3 IMPLANT
SUT VICRYL 2-0 SH 8X27 (SUTURE) ×3 IMPLANT
SUT VLOC 180 0 9IN GS21 (SUTURE) ×6 IMPLANT
SUTURE STRAT PDS 2-0 23 CT-2.5 (SUTURE) ×6 IMPLANT
SUTURE V-LC BRB 180 2/0GR6GS22 (SUTURE) IMPLANT
SYR BULB EAR ULCER 3OZ GRN STR (SYRINGE) ×3 IMPLANT
TOWEL GREEN STERILE FF (TOWEL DISPOSABLE) ×3 IMPLANT
TRAY FOLEY W/BAG SLVR 14FR (SET/KITS/TRAYS/PACK) ×3 IMPLANT
TRAY FOLEY W/BAG SLVR 14FR LF (SET/KITS/TRAYS/PACK) ×3 IMPLANT
UNDERPAD 30X36 HEAVY ABSORB (UNDERPADS AND DIAPERS) ×3 IMPLANT

## 2024-08-21 NOTE — Consult Note (Signed)
 I was called to the OR by the patient's operating surgeon to evaluate her appendix. At the time of my evaluation she was intubated, sedated, and under the care of Dr. Marilynne.  I evaluated her appendix and noted that she the tip was adherent to her pelvic wall and appeared somewhat thickened. There was a small volume of mucinous material adjacent to the tip. The base of the appendix appeared normal.  I reviewed the patient's chart but did not find any CT imaging of the abdomen. There are no colonoscopy reports in our system.  - I have recommended against any surgical intervention at this time.  - Would recommend clarifying that she is up to date on her colonoscopy and/or referring her to GI - Can perform CT AP as an outpatient - I will be happy to see her in the office if she has abnormal findings on her CT or colonoscopy that would require surgical intervention.  Cordella Idler, MD   General Surgeon [facility], [place]

## 2024-08-21 NOTE — Discharge Instructions (Signed)

## 2024-08-21 NOTE — Op Note (Signed)
 Operative Note  Preoperative Diagnosis: anterior vaginal prolapse, posterior vaginal prolapse, uterovaginal prolapse, incomplete, and stress urinary incontinence  Postoperative Diagnosis: same  Procedures performed:  Robotic assisted total laparoscopic hysterectomy with bilateral salpingectomy, sacrocolpopexy Lucita Lite Y), cystoscopy, midurethral sling (Advantage Fit), posterior repair  Implants:  Implant Name Type Inv. Item Serial No. Manufacturer Lot No. LRB No. Used Action  MESH LAJUANDA MAJORIE SE [id number] Mesh General MESH LAJUANDA MAJORIE SE NITA  [facility] [id number] N/A 1 Implanted  [employer] FIT [facility] - [id number] Sling SLING ADVANTAGE FIT [employer] [id number] N/A 1 Implanted    Attending Surgeon: Rosaline Caper, MD  Assistant: Jorene Moats, PA  Anesthesia: General endotracheal  Findings: 1. On vaginal exam, stage II prolapse present  2. On cystoscopy, normal bladder and urethral mucosa without injury or lesion. Brisk bilateral ureteral efflux present.    3. On laparoscopy, mucous like discharge present in the posterior cul-de-sac. Enlarged and adhesed appendix to the right pelvic sidewall.   Specimens:  ID Type Source Tests Collected by Time Destination  1 : uterus, cervix, bilateral fallopian tubes Tissue PATH Gyn benign resection SURGICAL PATHOLOGY Caper Rosaline SAILOR, MD 08/21/2024 [phone]   A : pelvic washings Body Fluid PATH Cytology Pelvic Washing CYTOLOGY - NON PAP Caper Rosaline SAILOR, MD 08/21/2024 [phone]     Estimated blood loss: 75 mL  IV fluids: 1450 mL  Urine output: 200 mL  Complications: none  Procedure in Detail:  After informed consent was obtained, the patient was taken to the operating room, where general anesthesia was induced and found to be adequate. She was placed in dorsolithotomy position in yellowfin stirrups. Her hips were noted not to be hyperflexed or hyperextended. Her arms were padded with gel  pads and tucked to her sides. Her hands were surrounded by foam. A padded strap was placed across her chest with foam between the pad and her skin. She was noted to be appropriately positioned with all pressure points well padded and off tension. A tilt test showed no slippage. She was prepped and draped in the usual sterile fashion. A uterine manipulator was placed in the uterus after sounding to 7 cm, an appropriately sized Koh ring was placed around the cervix, and a pneumo-occluder balloon was positioned in the vagina for later use.  A sterile Foley catheter was inserted.   0.25% plain Marcaine  was injected in the supraumbilical  area and an 8 mm supraumbilical skin incision was made with the scalpel.  A Veress needle was inserted into the incision, CO2 insufflation was started, a low opening pressure was noted, and pneumoperitoneum was obtained. The Veress needle was removed and a 8mm robotic trocar was placed. The robotic camera was inserted and intraperitoneal placement was confirmed. Survey of the abdomen and pelvis revealed the findings as noted above, specifically an enlarged and adhesed appendix below the right ovary. The sacrum appeared to be free of any adhesive disease. After determining placement for the other ports, Local anesthetic was injected at each site and two 8 mm incisions were made for robotic ports at 10 cm lateral to and at the level of the umbilical port. Two additional 8 mm incisions were made 10 cm lateral to these and 30 degrees down followed by 8 mm robotic ports - the right side for an assistant port. All trocars were placed sequentially under direct visualization of the camera. The patient was placed in Trendelenburg. The robot was docked on the patient's right side. Monopolar  endoshears alternating with the vessel sealer were placed in the right arm, a bipolar grasper was placed in the 2nd arm of the patient's left side, and a Tip up grasper was placed in the 3rd arm on the  patient's left side. Pelvic washing were obtained due to the mucinous discharge present in the posterior cul-de-sac. General surgery consult was obtained. Dr Polly presented and evaluated the appendix. After discussion with colleagues, he decided that the patient should obtain outpatient imaging and updated colonoscopy and follow up for further potential treatment. He agreed we could proceed with the case and mesh placement as planned.   Attention was then turned to the sacral promontory. The peritoneum overlying the sacral promontory was tented up, dissected sharply with monopolar scissors and electrosurgery using layer by layer technique.  Two transverse sutures of CV2 Gortex were placed in the anterior longitudinal ligament.  Attention was then turned to the robotic hysterectomy. The ureter was identified and was found to be well away from the planned site of incision. The mesosalpinx was cauterized and cut. The uteroovarian ligament was cauterized and cut. The right round ligament was grasped, cauterized, and transected with electrocautery. The anterior and posterior leaves of the broad ligament were taken down with cautery and sharp dissection. The uterine artery was skeletonized and the bladder flap was created on the right side with a combination of electrosurgery and sharp dissection. The KOH ring was identified. The right uterine artery was clamped, cauterized, and transected. In a similar fashion, the left side was taken down. The bladder was backfilled with 100cc. Further sharp dissection with combination of cautery was performed to further develop the bladder flap. The bladder was drained.  At this point, the KOH ring was completely hugging the cervix. The pneumo-occluder balloon in the vagina was inflated to maintain pneumoperitoneum. A colpotomy was performed with electrosurgical cutting current and the uterus and cervix were completely amputated from the vagina. The specimen was delivered through  the vagina. The posterior portion of the vaginal cuff was then grasped and pulled up to maintain pneumoperitoneum. The pneumo-occluder balloon was then replaced in the vagina. The right hand instrument was changed to a suture-cut needle driver. The vaginal cuff was then closed using a 0 V-lock suture in two layers.    The right hand instrument was replaced with monopolar endoshears. With a lucite probe in the vagina, the anterior vaginal dissection was then performed with sharp dissection and electrosurgery. The posterior vaginal dissection was then performed with sharp dissection and electrosurgery in order to dissect the rectum away from the posterior vagina.  A Y mesh was then inserted into the abdomen after trimming to appropriate size. With the probe in the vagina, the anterior leaf of the Y mesh was affixed to the anterior portion of the vagina using a 2-0 v-loc suture in a spiral pattern to distribute the suture evenly across the surface of the anterior mesh leaf. In a similar fashion, the posterior leaf of the Y mesh was attached to the posterior surface of the vagina with 2-0 v-loc suture. The tail of the mesh was then tunneled under the peritoneum up to the sacrum. The distal end of the mesh was then brought to overlie the sacrum. The correct amount of tension was determined in order to elevate the vagina, but not put the mesh under tension. The distal end of the mesh was then affixed to the anterior longitudinal sacral ligament using the previously placed stitches of CV2 Gortex. The excess distal  mesh was then cut and removed. The peritoneum was reapproximated over the mesh using 2-0 monocryl. The bladder flap was incorporated to completely retroperitonealize the mesh. All pedicles were carefully inspected and noted to be hemostatic as the CO2 gas was deflated. All instruments were removed from the patient's abdomen.   The Foley catheter was removed.  A 70-degree cystoscope was introduced, and  360-degree inspection revealed no injury, lesion or foreign body in the bladder. Brisk bilateral ureteral efflux was noted with the assistance of pyridium .  The bladder was drained and the cystoscope was removed.  The Foley catheter was replaced.  The robot was undocked. The CO2 gas was removed and the ports were removed.  The skin incisions were closed with subcutaneous stitches of 4-0 Monocryl and covered with skin glue.    The sling was performed next. A lonestar self-retraining retractor was placed with 4 stay hooks. The mid urethral area was located on the anterior vaginal wall.  Two Allis clamps were placed at the level of the midurethra. 1% lidocaine  with epinephrine  was injected into the vaginal mucosa. A vertical incision was made between the two clamps using a 15-blade scalpel.  Using sharp dissection, Metzenbaum scissors were used to make a periurethral tunnel from the vaginal incision towards the pubic rami bilaterally for the future sling tracts. The bladder was ensured to be empty. The trocar and attached sling were introduced into the right side of the periurethral vaginal incision, just inferior to the pubic symphysis on the right side. The trocar was guided through the endopelvic fascia and directly vertically.  While hugging the cephalad surface of the pubic bone, the trocar was guided out through the abdomen 2 fingerbreadths lateral to midline at the level of the pubic symphysis on the ipsilateral side. The trocar was placed on the left side in a similar fashion.  A 70-degree cystoscope was introduced, and 360-degree inspection revealed no trauma or trocars in the bladder, with brisk bilateral ureteral efflux.  The bladder was drained and the cystoscope was removed.  The Foley catheter was reinserted.  The sling was brought to lie beneath the mid-urethra.  A needle driver was placed behind the sling to ensure no tension.   The plastic sheath was removed from the sling and the distal ends of  the sling were trimmed just below the level of the skin incisions.  Tension-free positioning of the sling was confirmed. Vaginal inspection revealed no vaginotomy or sling perforations of the mucosa.  The vaginal mucosal edges were reapproximated using 2-0 Vicryl.  The suprapubic sling incisions were closed with Dermabond.   It was determined that a distal posterior repair was needed. Two Allis clamps were placed in the midline of the posterior vaginal wall defect.  1% lidocaine  with epinephrine  was injected into the vaginal mucosa. A vertical incision was made between these clamps with a 15 blade scalpel. The rectovaginal septum was then dissected off the vaginal mucosa bilaterally.  The rectovaginal septum was then reapproximated with plicating sutures of 2-0 Vicryl.  After placement of the first plication stitch two fingers were inserted into the vaginal to confirm adequate caliber.  The last distal stitch incorporated the perineal body in a U stitch fashion.  After plication, the excess vaginal mucosa was trimmed and the vaginal mucosa was reapproximated using 2-0 Vicryl sutures in a running fashion.  A rectal examination was normal and confirmed no sutures within the rectum.  The patient tolerated the procedure well.  She was awakened from anesthesia  and transferred to the recovery room in stable condition. Sponge, lap, and needle counts were correct x 2.   [name], MD

## 2024-08-21 NOTE — Transfer of Care (Signed)
 Immediate Anesthesia Transfer of Care Note  Patient: Carol Sutton  Procedure(s) Performed: HYSTERECTOMY, TOTAL, LAPAROSCOPIC, ROBOT-ASSISTED WITH SALPINGECTOMY (Pelvis) SACROCOLPOPEXY, ROBOT-ASSISTED, LAPAROSCOPIC (Pelvis) CREATION, URETHRAL SLING, RETROPUBIC APPROACH, USING POLYPROPYLENE TAPE (Vagina ) CYSTOSCOPY (Bladder) COLPORRHAPHY, POSTERIOR, FOR RECTOCELE REPAIR (Vagina )  Patient Location: PACU  Anesthesia Type:General  Level of Consciousness: awake, alert , oriented, and patient cooperative  Airway & Oxygen Therapy: Patient Spontanous Breathing and Patient connected to face mask oxygen  Post-op Assessment: Report given to RN and Post -op Vital signs reviewed and stable  Post vital signs: Reviewed and stable  Last Vitals:  Vitals Value Taken Time  BP 161/120 08/21/24 11:26  Temp    Pulse 89 08/21/24 11:30  Resp 12 08/21/24 11:30  SpO2 99 % 08/21/24 11:30  Vitals shown include unfiled device data.  Last Pain:  Vitals:   08/21/24 0601  TempSrc: Oral  PainSc: 0-No pain      Patients Stated Pain Goal: 4 (08/21/24 0601)  Complications: There were no known notable events for this encounter.

## 2024-08-21 NOTE — Progress Notes (Signed)
 Due to abnormal appendix intraoperatively, general surgery requested CT A/P w/ constrast and Colonoscopy.

## 2024-08-21 NOTE — Anesthesia Procedure Notes (Signed)
 Procedure Name: Intubation Date/Time: 08/21/2024 7:44 AM  Performed by: Erick Fitz, CRNAPre-anesthesia Checklist: Patient identified, Emergency Drugs available, Suction available, Patient being monitored and Timeout performed Patient Re-evaluated:Patient Re-evaluated prior to induction Oxygen Delivery Method: Circle system utilized Preoxygenation: Pre-oxygenation with 100% oxygen Induction Type: IV induction Ventilation: Mask ventilation without difficulty Laryngoscope Size: Mac and 3 Grade View: Grade I Tube type: Oral Tube size: 7.0 mm Number of attempts: 1 Airway Equipment and Method: Stylet Placement Confirmation: ETT inserted through vocal cords under direct vision, positive ETCO2, CO2 detector and breath sounds checked- equal and bilateral Secured at: 22 cm Tube secured with: Tape (secured with 1/2 white silk tape) Dental Injury: Teeth and Oropharynx as per pre-operative assessment

## 2024-08-21 NOTE — Interval H&P Note (Signed)
 History and Physical Interval Note:  08/21/2024 7:10 AM  Carol Sutton  has presented today for surgery, with the diagnosis of SUI/ prolpase.  The various methods of treatment have been discussed with the patient and family. After consideration of risks, benefits and other options for treatment, the patient has consented to  Procedures: HYSTERECTOMY, TOTAL, LAPAROSCOPIC, ROBOT-ASSISTED WITH SALPINGECTOMY (N/A) SACROCOLPOPEXY, ROBOT-ASSISTED, LAPAROSCOPIC (N/A) CREATION, URETHRAL SLING, RETROPUBIC APPROACH, USING POLYPROPYLENE TAPE (N/A) CYSTOSCOPY (N/A) COLPORRHAPHY, POSTERIOR, FOR RECTOCELE REPAIR (N/A) as a surgical intervention.  The patient's history has been reviewed, patient examined, no change in status, stable for surgery.  I have reviewed the patient's chart and labs.  Questions were answered to the patient's satisfaction.     [name]

## 2024-08-21 NOTE — Telephone Encounter (Signed)
 Carol Sutton underwent Robotic assisted total laparoscopic hysterectomy with bilateral salpingectomy, sacrocolpopexy Lucita Lite Y), cystoscopy, midurethral sling (Advantage Fit), posterior repair on 08/21/2024.   She passed her voiding trial.  was backfilled into the bladder Voided  PVR by bladder scan was 70ml.   She was discharged without a catheter. Please call her for a routine post op check. Thanks!  [name], MD

## 2024-08-22 ENCOUNTER — Encounter (HOSPITAL_COMMUNITY): Payer: Self-pay | Admitting: Obstetrics and Gynecology

## 2024-08-22 ENCOUNTER — Other Ambulatory Visit: Payer: Self-pay | Admitting: Family Medicine

## 2024-08-22 DIAGNOSIS — Z1231 Encounter for screening mammogram for malignant neoplasm of breast: Secondary | ICD-10-CM

## 2024-08-22 LAB — SURGICAL PATHOLOGY

## 2024-08-22 LAB — CYTOLOGY - NON PAP

## 2024-08-22 NOTE — Anesthesia Postprocedure Evaluation (Signed)
"   Anesthesia Post Note  Patient: Carol Sutton  Procedure(s) Performed: HYSTERECTOMY, TOTAL, LAPAROSCOPIC, ROBOT-ASSISTED WITH SALPINGECTOMY (Pelvis) SACROCOLPOPEXY, ROBOT-ASSISTED, LAPAROSCOPIC (Pelvis) CREATION, URETHRAL SLING, RETROPUBIC APPROACH, USING POLYPROPYLENE TAPE (Vagina ) CYSTOSCOPY (Bladder) COLPORRHAPHY, POSTERIOR, FOR RECTOCELE REPAIR (Vagina )     Patient location during evaluation: PACU Anesthesia Type: General Level of consciousness: awake and alert Pain management: pain level controlled Vital Signs Assessment: post-procedure vital signs reviewed and stable Respiratory status: spontaneous breathing, nonlabored ventilation, respiratory function stable and patient connected to nasal cannula oxygen Cardiovascular status: blood pressure returned to baseline and stable Postop Assessment: no apparent nausea or vomiting Anesthetic complications: no   There were no known notable events for this encounter.  Last Vitals:  Vitals:   08/21/24 1345 08/21/24 1445  BP: 112/61 120/72  Pulse: 77 76  Resp: 10 16  Temp: 36.7 C   SpO2: 98% 95%    Last Pain:  Vitals:   08/21/24 1315  TempSrc:   PainSc: Asleep   Pain Goal: Patients Stated Pain Goal: 4 (08/21/24 0601)                 Laikyn Gewirtz L Marten Iles      "

## 2024-08-23 NOTE — Telephone Encounter (Signed)
 LM for the pt to call back regarding the post op follow up call. KD CMA

## 2024-08-23 NOTE — Telephone Encounter (Signed)
 Mliss  underwent Hysterectomy, Total, Laparoscopic, Robot-assisted With Salpingectomy, Sacrocolpopexy, Robot-assisted, Laparoscopic, Creation, Urethral Sling, Retropubic Approach, Using Polypropylene Tape, Cystoscopy, and Colporrhaphy, Posterior, For Rectocele Repair  on 08/21/2024  with [] Dr Marilynne [] Dr Guadlupe.  The patient reports that her pain is controlled.  She is taking [] No Medication [x] Acetaminophen  500mg  every 6 hours [x] Ibuprofen  600mg  every 6 hours or [x]  Prescribed Narcotic.  Her pain level is 3[] with medication [] Without medication is.   She reports vaginal bleeding.  The patient is tolerating PO fluids and solids. She has not had a bowel movement and is taking Miralax  for a bowel regimen. She is not passing gas.  She was discharged without a catheter.   []  Discharged without a catheter, the patient does feel as if she is emptying her bladder.  [] Discharged with a catheter, the patient is not having any concerns with her catheter.  She will not return for a voiding trial. [] Verified scheduled date and time with patient.  She does not having any additional questions.  Reviewed Post operative instructions as needed to answer additional questions.   CC'd note to patient's provider.

## 2024-08-24 ENCOUNTER — Telehealth: Payer: Self-pay

## 2024-08-24 NOTE — Telephone Encounter (Signed)
 Patient wanted to know if she still needs to get the CT done since her pathology report was negative? Please advise.

## 2024-08-29 ENCOUNTER — Telehealth (HOSPITAL_BASED_OUTPATIENT_CLINIC_OR_DEPARTMENT_OTHER): Payer: Self-pay | Admitting: Obstetrics and Gynecology

## 2024-08-29 ENCOUNTER — Ambulatory Visit (HOSPITAL_COMMUNITY)

## 2024-08-29 ENCOUNTER — Encounter (HOSPITAL_COMMUNITY): Payer: Self-pay

## 2024-08-29 NOTE — Telephone Encounter (Signed)
 Order ID: [id number]       In Progress  Anticipated Determination Date: 08/31/2024    Submitted online with [employer].

## 2024-08-30 ENCOUNTER — Telehealth

## 2024-09-04 ENCOUNTER — Telehealth: Payer: Self-pay | Admitting: Obstetrics and Gynecology

## 2024-09-04 ENCOUNTER — Encounter (HOSPITAL_BASED_OUTPATIENT_CLINIC_OR_DEPARTMENT_OTHER): Payer: Self-pay | Admitting: Obstetrics and Gynecology

## 2024-09-04 ENCOUNTER — Ambulatory Visit

## 2024-09-04 NOTE — Telephone Encounter (Signed)
 Spoke with Carol Sutton and provided her with the contact number for [facility] in [place] to schedule her CT scan. Authorized prior authorization has been uploaded to her chart. PA valid 09/04/24-10/03/24.

## 2024-09-04 NOTE — Telephone Encounter (Signed)
 Patient has been notified

## 2024-09-05 ENCOUNTER — Encounter (HOSPITAL_BASED_OUTPATIENT_CLINIC_OR_DEPARTMENT_OTHER): Payer: Self-pay

## 2024-09-05 ENCOUNTER — Other Ambulatory Visit (HOSPITAL_BASED_OUTPATIENT_CLINIC_OR_DEPARTMENT_OTHER)

## 2024-09-05 ENCOUNTER — Ambulatory Visit (HOSPITAL_COMMUNITY)

## 2024-09-06 ENCOUNTER — Ambulatory Visit (HOSPITAL_COMMUNITY)
Admission: RE | Admit: 2024-09-06 | Discharge: 2024-09-06 | Disposition: A | Discharge status: Home / Self Care | Payer: Self-pay | Source: Ambulatory Visit | Attending: Family Medicine | Admitting: Family Medicine

## 2024-09-06 ENCOUNTER — Telehealth: Payer: Self-pay

## 2024-09-06 DIAGNOSIS — E7800 Pure hypercholesterolemia, unspecified: Secondary | ICD-10-CM | POA: Insufficient documentation

## 2024-09-06 NOTE — Telephone Encounter (Signed)
 Patient called stating she was told by Rosina to bring the CT scan CD to the office. She is expected to drop it off today.

## 2024-09-13 ENCOUNTER — Ambulatory Visit
Admission: RE | Admit: 2024-09-13 | Discharge: 2024-09-13 | Disposition: A | Source: Ambulatory Visit | Attending: Family Medicine | Admitting: Family Medicine

## 2024-09-13 DIAGNOSIS — Z1231 Encounter for screening mammogram for malignant neoplasm of breast: Secondary | ICD-10-CM

## 2024-10-03 ENCOUNTER — Encounter: Admitting: Obstetrics and Gynecology
